# Patient Record
Sex: Male | Born: 1990 | Race: Black or African American | Hispanic: No | Marital: Single | Smoking: Current every day smoker
Health system: Southern US, Community
[De-identification: ages and names within clinical notes are randomized; demographics above are authoritative.]

---
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
## 2015-07-01 ENCOUNTER — Encounter (HOSPITAL_COMMUNITY): Payer: Self-pay | Admitting: *Deleted

## 2015-07-01 ENCOUNTER — Emergency Department (HOSPITAL_COMMUNITY)
Admission: EM | Admit: 2015-07-01 | Discharge: 2015-07-01 | Disposition: A | Discharge status: Home / Self Care | Payer: Self-pay | Attending: Emergency Medicine | Admitting: Emergency Medicine

## 2015-07-01 DIAGNOSIS — Z720 Tobacco use: Secondary | ICD-10-CM | POA: Insufficient documentation

## 2015-07-01 DIAGNOSIS — Z202 Contact with and (suspected) exposure to infections with a predominantly sexual mode of transmission: Secondary | ICD-10-CM | POA: Insufficient documentation

## 2015-07-01 DIAGNOSIS — R011 Cardiac murmur, unspecified: Secondary | ICD-10-CM | POA: Insufficient documentation

## 2015-07-01 MED ORDER — AZITHROMYCIN 250 MG PO TABS
1000.0000 mg | ORAL_TABLET | Freq: Once | ORAL | Status: AC
  Administered 2015-07-01: 1000 mg via ORAL
  Filled 2015-07-01: qty 4

## 2015-07-01 MED ORDER — LIDOCAINE HCL (PF) 1 % IJ SOLN
INTRAMUSCULAR | Status: AC
  Administered 2015-07-01: 2.1 mL
  Filled 2015-07-01: qty 5

## 2015-07-01 MED ORDER — CEFTRIAXONE SODIUM 250 MG IJ SOLR
250.0000 mg | Freq: Once | INTRAMUSCULAR | Status: AC
  Administered 2015-07-01: 250 mg via INTRAMUSCULAR
  Filled 2015-07-01: qty 250

## 2015-07-01 MED ORDER — LIDOCAINE HCL 2 % IJ SOLN
INTRAMUSCULAR | Status: AC
  Filled 2015-07-01: qty 20

## 2015-07-01 NOTE — Discharge Instructions (Signed)
Sexually Transmitted Disease °A sexually transmitted disease (STD) is a disease or infection that may be passed (transmitted) from person to person, usually during sexual activity. This may happen by way of saliva, semen, blood, vaginal mucus, or urine. Common STDs include:  °· Gonorrhea.   °· Chlamydia.   °· Syphilis.   °· HIV and AIDS.   °· Genital herpes.   °· Hepatitis B and C.   °· Trichomonas.   °· Human papillomavirus (HPV).   °· Pubic lice.   °· Scabies. °· Mites. °· Bacterial vaginosis. °WHAT ARE CAUSES OF STDs? °An STD may be caused by bacteria, a virus, or parasites. STDs are often transmitted during sexual activity if one person is infected. However, they may also be transmitted through nonsexual means. STDs may be transmitted after:  °· Sexual intercourse with an infected person.   °· Sharing sex toys with an infected person.   °· Sharing needles with an infected person or using unclean piercing or tattoo needles. °· Having intimate contact with the genitals, mouth, or rectal areas of an infected person.   °· Exposure to infected fluids during birth. °WHAT ARE THE SIGNS AND SYMPTOMS OF STDs? °Different STDs have different symptoms. Some people may not have any symptoms. If symptoms are present, they may include:  °· Painful or bloody urination.   °· Pain in the pelvis, abdomen, vagina, anus, throat, or eyes.   °· A skin rash, itching, or irritation. °· Growths, ulcerations, blisters, or sores in the genital and anal areas. °· Abnormal vaginal discharge with or without bad odor.   °· Penile discharge in men.   °· Fever.   °· Pain or bleeding during sexual intercourse.   °· Swollen glands in the groin area.   °· Yellow skin and eyes (jaundice). This is seen with hepatitis.   °· Swollen testicles. °· Infertility. °· Sores and blisters in the mouth. °HOW ARE STDs DIAGNOSED? °To make a diagnosis, your health care provider may:  °· Take a medical history.   °· Perform a physical exam.   °· Take a sample of  any discharge to examine. °· Swab the throat, cervix, opening to the penis, rectum, or vagina for testing. °· Test a sample of your first morning urine.   °· Perform blood tests.   °· Perform a Pap test, if this applies.   °· Perform a colposcopy.   °· Perform a laparoscopy.   °HOW ARE STDs TREATED? ° Treatment depends on the STD. Some STDs may be treated but not cured.  °· Chlamydia, gonorrhea, trichomonas, and syphilis can be cured with antibiotic medicine.   °· Genital herpes, hepatitis, and HIV can be treated, but not cured, with prescribed medicines. The medicines lessen symptoms.   °· Genital warts from HPV can be treated with medicine or by freezing, burning (electrocautery), or surgery. Warts may come back.   °· HPV cannot be cured with medicine or surgery. However, abnormal areas may be removed from the cervix, vagina, or vulva.   °· If your diagnosis is confirmed, your recent sexual partners need treatment. This is true even if they are symptom-free or have a negative culture or evaluation. They should not have sex until their health care providers say it is okay. °HOW CAN I REDUCE MY RISK OF GETTING AN STD? °Take these steps to reduce your risk of getting an STD: °· Use latex condoms, dental dams, and water-soluble lubricants during sexual activity. Do not use petroleum jelly or oils. °· Avoid having multiple sex partners. °· Do not have sex with someone who has other sex partners. °· Do not have sex with anyone you do not know or who is at   high risk for an STD. °· Avoid risky sex practices that can break your skin. °· Do not have sex if you have open sores on your mouth or skin. °· Avoid drinking too much alcohol or taking illegal drugs. Alcohol and drugs can affect your judgment and put you in a vulnerable position. °· Avoid engaging in oral and anal sex acts. °· Get vaccinated for HPV and hepatitis. If you have not received these vaccines in the past, talk to your health care provider about whether one  or both might be right for you.   °· If you are at risk of being infected with HIV, it is recommended that you take a prescription medicine daily to prevent HIV infection. This is called pre-exposure prophylaxis (PrEP). You are considered at risk if: °¨ You are a man who has sex with other men (MSM). °¨ You are a heterosexual man or woman and are sexually active with more than one partner. °¨ You take drugs by injection. °¨ You are sexually active with a partner who has HIV. °· Talk with your health care provider about whether you are at high risk of being infected with HIV. If you choose to begin PrEP, you should first be tested for HIV. You should then be tested every 3 months for as long as you are taking PrEP.   °WHAT SHOULD I DO IF I THINK I HAVE AN STD? °· See your health care provider.   °· Tell your sexual partner(s). They should be tested and treated for any STDs. °· Do not have sex until your health care provider says it is okay.  °WHEN SHOULD I GET IMMEDIATE MEDICAL CARE? °Contact your health care provider right away if:  °· You have severe abdominal pain. °· You are a man and notice swelling or pain in your testicles. °· You are a woman and notice swelling or pain in your vagina. °Document Released: 12/14/2002 Document Revised: 09/28/2013 Document Reviewed: 04/13/2013 °[employer]® Patient Information ©2015 [employer]. This information is not intended to replace advice given to you by your health care provider. Make sure you discuss any questions you have with your health care provider. ° °Safe Sex °Safe sex is about reducing the risk of giving or getting a sexually transmitted disease (STD). STDs are spread through sexual contact involving the genitals, mouth, or rectum. Some STDs can be cured and others cannot. Safe sex can also prevent unintended pregnancies.  °WHAT ARE SOME SAFE SEX PRACTICES? °· Limit your sexual activity to only one partner who is having sex with only you. °· Talk to your partner  about his or her past partners, past STDs, and drug use. °· Use a condom every time you have sexual intercourse. This includes vaginal, oral, and anal sexual activity. Both females and males should wear condoms during oral sex. Only use latex or polyurethane condoms and water-based lubricants. Using petroleum-based lubricants or oils to lubricate a condom will weaken the condom and increase the chance that it will break. The condom should be in place from the beginning to the end of sexual activity. Wearing a condom reduces, but does not completely eliminate, your risk of getting or giving an STD. STDs can be spread by contact with infected body fluids and skin. °· Get vaccinated for hepatitis B and HPV. °· Avoid alcohol and recreational drugs, which can affect your judgment. You may forget to use a condom or participate in high-risk sex. °· For females, avoid douching after sexual intercourse. Douching can spread an infection   farther into the reproductive tract. °· Check your body for signs of sores, blisters, rashes, or unusual discharge. See your health care provider if you notice any of these signs. °· Avoid sexual contact if you have symptoms of an infection or are being treated for an STD. If you or your partner has herpes, avoid sexual contact when blisters are present. Use condoms at all other times. °· If you are at risk of being infected with HIV, it is recommended that you take a prescription medicine daily to prevent HIV infection. This is called pre-exposure prophylaxis (PrEP). You are considered at risk if: °¨ You are a man who has sex with other men (MSM). °¨ You are a heterosexual man or woman who is sexually active with more than one partner. °¨ You take drugs by injection. °¨ You are sexually active with a partner who has HIV. °· Talk with your health care provider about whether you are at high risk of being infected with HIV. If you choose to begin PrEP, you should first be tested for HIV. You  should then be tested every 3 months for as long as you are taking PrEP. °· See your health care provider for regular screenings, exams, and tests for other STDs. Before having sex with a new partner, each of you should be screened for STDs and should talk about the results with each other. °WHAT ARE THE BENEFITS OF SAFE SEX?  °· There is less chance of getting or giving an STD. °· You can prevent unwanted or unintended pregnancies. °· By discussing safe sex concerns with your partner, you may increase feelings of intimacy, comfort, trust, and honesty between the two of you. °Document Released: 10/31/2004 Document Revised: 02/07/2014 Document Reviewed: 03/16/2012 °[employer]® Patient Information ©2015 [employer]. This information is not intended to replace advice given to you by your health care provider. Make sure you discuss any questions you have with your health care provider. ° °

## 2015-07-01 NOTE — ED Provider Notes (Signed)
CSN: [id number]     Arrival date & time 07/01/15  1452 History  This chart was scribed for Joycie Peek, PA-C, working with Derwood Kaplan, MD by Chestine Spore, ED Scribe. The patient was seen in room WTR6/WTR6 at 3:17 PM.    Chief Complaint  Patient presents with  . Exposure to STD     The history is provided by the patient. No language interpreter was used.    Steven [name] is a 24 y.o. male who presents to the Emergency Department complaining of exposure to STD onset 3 weeks. He notes that he was informed by his girlfriend 3 weeks ago that she had an STD and he thinks she had chlamydia. He states that she is having associated symptoms of intermittent dysuria. He denies penile discharge, penile pain/swelling, testicular pain/swelling, abdominal pain, n/v, fever, chills, back pain, rectal pain, hematuria, and any other symptoms. Denies allergies to any medications.    History reviewed. No pertinent past medical history. History reviewed. No pertinent past surgical history. History reviewed. No pertinent family history. Social History  Substance Use Topics  . Smoking status: Current Every Day Smoker    Types: Cigars  . Smokeless tobacco: None  . Alcohol Use: No    Review of Systems  Gastrointestinal: Negative for abdominal pain and rectal pain.  Genitourinary: Positive for dysuria. Negative for hematuria, discharge, penile swelling, scrotal swelling, difficulty urinating, genital sores, penile pain and testicular pain.  Musculoskeletal: Negative for back pain.  Skin: Negative for color change and rash.      Allergies  Review of patient's allergies indicates not on file.  Home Medications   Prior to Admission medications   Not on File   BP 120/60 mmHg  Pulse 62  Temp(Src) 98.3 F (36.8 C) (Oral)  Ht  (1.753 m)  Wt 140 lb (63.504 kg)  BMI 20.67 kg/m2  SpO2 97% Physical Exam  Constitutional: He is oriented to person, place, and time. He appears well-developed  and well-nourished. No distress.  HENT:  Head: Normocephalic and atraumatic.  Eyes: EOM are normal.  Neck: Neck supple.  Cardiovascular: Normal rate and regular rhythm.  Exam reveals no gallop and no friction rub.   Murmur heard.  Systolic murmur is present with a grade of 1/6  Pulmonary/Chest: Effort normal and breath sounds normal. No respiratory distress. He has no wheezes. He has no rales.  Abdominal: Soft. There is no tenderness. Hernia confirmed negative in the right inguinal area and confirmed negative in the left inguinal area.  Genitourinary: Testes normal and penis normal. Right testis shows no swelling and no tenderness. Left testis shows no swelling and no tenderness. Circumcised. No penile tenderness. No discharge found.  Chaperone present for GU exam. No other lesions or deformities. Testicles nl. No orchitis. No epididymitis.   Musculoskeletal: Normal range of motion.  Lymphadenopathy:       Right: No inguinal adenopathy present.       Left: No inguinal adenopathy present.  Neurological: He is alert and oriented to person, place, and time.  Skin: Skin is warm and dry.  Psychiatric: He has a normal mood and affect. His behavior is normal.  Nursing note and vitals reviewed.   ED Course  Procedures (including critical care time) DIAGNOSTIC STUDIES: Oxygen Saturation is 97% on RA, nl by my interpretation.    COORDINATION OF CARE: 3:22 PM Discussed treatment plan with pt at bedside which includes UA GC/Chlamydia, rocephin injection and Zithromax and pt agreed to plan.  Labs Review Labs Reviewed - No data to display  Imaging Review No results found. I have personally reviewed and evaluated these images and lab results as part of my medical decision-making.   EKG Interpretation None     Meds given in ED:  Medications  cefTRIAXone (ROCEPHIN) injection 250 mg (250 mg Intramuscular Given 07/01/15 1554)  azithromycin (ZITHROMAX) tablet 1,000 mg (1,000 mg Oral Given  07/01/15 1552)  lidocaine (PF) (XYLOCAINE) 1 % injection (2.1 mLs  Given 07/01/15 1554)    There are no discharge medications for this patient.  Filed Vitals:   07/01/15 1504  BP: 120/60  Pulse: 62  Temp: 98.3 F (36.8 C)  TempSrc: Oral  Height:  (1.753 m)  Weight: 140 lb (63.504 kg)  SpO2: 97%    MDM  Vitals stable - WNL -afebrile Pt resting comfortably in ED. PE--normal GU exam. Labwork--pending GC chlamydia urine. Treated empirically for GC chlamydia with ceftriaxone and azithromycin in the ED. Discussed follow-up with PCP/health department for further evaluation of GU symptoms. Also discussed safe sex practices. I discussed all relevant lab findings and imaging results with pt and they verbalized understanding. Discussed f/u with PCP within 48 hrs and return precautions, pt very amenable to plan.  Final diagnoses:  Exposure to STD    I personally performed the services described in this documentation, which was scribed in my presence. The recorded information has been reviewed and is accurate.    Joycie Peek, PA-C 07/01/15 1732  Derwood Kaplan, MD 07/02/15 [phone]

## 2015-07-01 NOTE — ED Notes (Signed)
Pt states "she was told she had an STD.  I have had some burning."  Pt denies penile d/c.

## 2015-07-02 LAB — HIV ANTIBODY (ROUTINE TESTING W REFLEX): HIV Screen 4th Generation wRfx: NONREACTIVE

## 2015-07-03 LAB — GC/CHLAMYDIA PROBE AMP (~~LOC~~) NOT AT ARMC
Chlamydia: POSITIVE — AB
Neisseria Gonorrhea: NEGATIVE

## 2015-07-05 ENCOUNTER — Telehealth (HOSPITAL_BASED_OUTPATIENT_CLINIC_OR_DEPARTMENT_OTHER): Payer: Self-pay | Admitting: Emergency Medicine

## 2015-08-12 ENCOUNTER — Telehealth (HOSPITAL_COMMUNITY): Payer: Self-pay

## 2015-08-12 NOTE — Telephone Encounter (Signed)
Unable to contact pt by mail or telephone. Unable to communicate lab results or treatment changes. 

## 2016-08-06 ENCOUNTER — Inpatient Hospital Stay (HOSPITAL_COMMUNITY)
Admission: EM | Admit: 2016-08-06 | Discharge: 2016-08-14 | DRG: 958 | Disposition: A | Discharge status: Rehabilitation Facility | Payer: No Typology Code available for payment source | Attending: Surgery | Admitting: Surgery

## 2016-08-06 ENCOUNTER — Other Ambulatory Visit (HOSPITAL_COMMUNITY): Payer: Self-pay | Admitting: Radiology

## 2016-08-06 ENCOUNTER — Emergency Department (HOSPITAL_COMMUNITY): Payer: No Typology Code available for payment source

## 2016-08-06 ENCOUNTER — Encounter (HOSPITAL_COMMUNITY): Payer: Self-pay | Admitting: Emergency Medicine

## 2016-08-06 DIAGNOSIS — T07XXXA Unspecified multiple injuries, initial encounter: Secondary | ICD-10-CM | POA: Diagnosis present

## 2016-08-06 DIAGNOSIS — S22009A Unspecified fracture of unspecified thoracic vertebra, initial encounter for closed fracture: Secondary | ICD-10-CM | POA: Diagnosis present

## 2016-08-06 DIAGNOSIS — S060XAA Concussion with loss of consciousness status unknown, initial encounter: Secondary | ICD-10-CM | POA: Diagnosis present

## 2016-08-06 DIAGNOSIS — Z720 Tobacco use: Secondary | ICD-10-CM

## 2016-08-06 DIAGNOSIS — E8809 Other disorders of plasma-protein metabolism, not elsewhere classified: Secondary | ICD-10-CM

## 2016-08-06 DIAGNOSIS — F17290 Nicotine dependence, other tobacco product, uncomplicated: Secondary | ICD-10-CM | POA: Diagnosis present

## 2016-08-06 DIAGNOSIS — R079 Chest pain, unspecified: Secondary | ICD-10-CM | POA: Diagnosis not present

## 2016-08-06 DIAGNOSIS — S42309A Unspecified fracture of shaft of humerus, unspecified arm, initial encounter for closed fracture: Secondary | ICD-10-CM | POA: Diagnosis present

## 2016-08-06 DIAGNOSIS — S60511A Abrasion of right hand, initial encounter: Secondary | ICD-10-CM | POA: Diagnosis present

## 2016-08-06 DIAGNOSIS — D72820 Lymphocytosis (symptomatic): Secondary | ICD-10-CM

## 2016-08-06 DIAGNOSIS — S82401A Unspecified fracture of shaft of right fibula, initial encounter for closed fracture: Secondary | ICD-10-CM | POA: Diagnosis present

## 2016-08-06 DIAGNOSIS — E46 Unspecified protein-calorie malnutrition: Secondary | ICD-10-CM

## 2016-08-06 DIAGNOSIS — S42351A Displaced comminuted fracture of shaft of humerus, right arm, initial encounter for closed fracture: Secondary | ICD-10-CM | POA: Diagnosis present

## 2016-08-06 DIAGNOSIS — S32810A Multiple fractures of pelvis with stable disruption of pelvic ring, initial encounter for closed fracture: Principal | ICD-10-CM

## 2016-08-06 DIAGNOSIS — S02113A Unspecified occipital condyle fracture, initial encounter for closed fracture: Secondary | ICD-10-CM | POA: Diagnosis present

## 2016-08-06 DIAGNOSIS — M792 Neuralgia and neuritis, unspecified: Secondary | ICD-10-CM

## 2016-08-06 DIAGNOSIS — S27321A Contusion of lung, unilateral, initial encounter: Secondary | ICD-10-CM | POA: Diagnosis present

## 2016-08-06 DIAGNOSIS — S42301A Unspecified fracture of shaft of humerus, right arm, initial encounter for closed fracture: Secondary | ICD-10-CM | POA: Diagnosis present

## 2016-08-06 DIAGNOSIS — D62 Acute posthemorrhagic anemia: Secondary | ICD-10-CM | POA: Diagnosis not present

## 2016-08-06 DIAGNOSIS — S060X9A Concussion with loss of consciousness of unspecified duration, initial encounter: Secondary | ICD-10-CM | POA: Diagnosis present

## 2016-08-06 DIAGNOSIS — Y92410 Unspecified street and highway as the place of occurrence of the external cause: Secondary | ICD-10-CM

## 2016-08-06 DIAGNOSIS — G8918 Other acute postprocedural pain: Secondary | ICD-10-CM

## 2016-08-06 DIAGNOSIS — S270XXA Traumatic pneumothorax, initial encounter: Secondary | ICD-10-CM | POA: Diagnosis present

## 2016-08-06 DIAGNOSIS — K5903 Drug induced constipation: Secondary | ICD-10-CM

## 2016-08-06 DIAGNOSIS — Z419 Encounter for procedure for purposes other than remedying health state, unspecified: Secondary | ICD-10-CM

## 2016-08-06 DIAGNOSIS — S129XXA Fracture of neck, unspecified, initial encounter: Secondary | ICD-10-CM | POA: Diagnosis present

## 2016-08-06 DIAGNOSIS — J939 Pneumothorax, unspecified: Secondary | ICD-10-CM

## 2016-08-06 DIAGNOSIS — R339 Retention of urine, unspecified: Secondary | ICD-10-CM

## 2016-08-06 DIAGNOSIS — S32599A Other specified fracture of unspecified pubis, initial encounter for closed fracture: Secondary | ICD-10-CM | POA: Diagnosis present

## 2016-08-06 DIAGNOSIS — S82141A Displaced bicondylar fracture of right tibia, initial encounter for closed fracture: Secondary | ICD-10-CM | POA: Diagnosis present

## 2016-08-06 LAB — I-STAT CG4 LACTIC ACID, ED: LACTIC ACID, VENOUS: 2.82 mmol/L — AB (ref 0.5–1.9)

## 2016-08-06 MED ORDER — FENTANYL CITRATE (PF) 100 MCG/2ML IJ SOLN
100.0000 ug | Freq: Once | INTRAMUSCULAR | Status: AC
  Administered 2016-08-07: 100 ug via INTRAVENOUS

## 2016-08-06 MED ORDER — FENTANYL CITRATE (PF) 100 MCG/2ML IJ SOLN
INTRAMUSCULAR | Status: AC
  Administered 2016-08-07: 100 ug via INTRAVENOUS
  Filled 2016-08-06: qty 2

## 2016-08-06 MED ORDER — IOPAMIDOL (ISOVUE-300) INJECTION 61%
INTRAVENOUS | Status: AC
  Filled 2016-08-06: qty 100

## 2016-08-06 MED ORDER — LIDOCAINE-EPINEPHRINE (PF) 2 %-1:200000 IJ SOLN
10.0000 mL | Freq: Once | INTRAMUSCULAR | Status: AC
  Administered 2016-08-06: 10 mL
  Filled 2016-08-06: qty 20

## 2016-08-06 NOTE — ED Notes (Signed)
Family at bedside. 
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
## 2016-08-06 NOTE — ED Notes (Signed)
Patient's sister at bedside.
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
## 2016-08-07 ENCOUNTER — Encounter (HOSPITAL_COMMUNITY): Payer: Self-pay | Admitting: Radiology

## 2016-08-07 ENCOUNTER — Emergency Department (HOSPITAL_COMMUNITY): Payer: No Typology Code available for payment source

## 2016-08-07 DIAGNOSIS — S27321A Contusion of lung, unilateral, initial encounter: Secondary | ICD-10-CM | POA: Diagnosis present

## 2016-08-07 DIAGNOSIS — T07XXXA Unspecified multiple injuries, initial encounter: Secondary | ICD-10-CM | POA: Diagnosis present

## 2016-08-07 DIAGNOSIS — S42309A Unspecified fracture of shaft of humerus, unspecified arm, initial encounter for closed fracture: Secondary | ICD-10-CM | POA: Diagnosis present

## 2016-08-07 DIAGNOSIS — S42301A Unspecified fracture of shaft of humerus, right arm, initial encounter for closed fracture: Secondary | ICD-10-CM | POA: Diagnosis present

## 2016-08-07 DIAGNOSIS — S060X9A Concussion with loss of consciousness of unspecified duration, initial encounter: Secondary | ICD-10-CM | POA: Diagnosis present

## 2016-08-07 DIAGNOSIS — S129XXA Fracture of neck, unspecified, initial encounter: Secondary | ICD-10-CM | POA: Diagnosis present

## 2016-08-07 DIAGNOSIS — D62 Acute posthemorrhagic anemia: Secondary | ICD-10-CM | POA: Diagnosis not present

## 2016-08-07 DIAGNOSIS — S82401A Unspecified fracture of shaft of right fibula, initial encounter for closed fracture: Secondary | ICD-10-CM | POA: Diagnosis present

## 2016-08-07 DIAGNOSIS — S22009A Unspecified fracture of unspecified thoracic vertebra, initial encounter for closed fracture: Secondary | ICD-10-CM | POA: Diagnosis present

## 2016-08-07 DIAGNOSIS — S32599A Other specified fracture of unspecified pubis, initial encounter for closed fracture: Secondary | ICD-10-CM | POA: Diagnosis present

## 2016-08-07 DIAGNOSIS — S060XAA Concussion with loss of consciousness status unknown, initial encounter: Secondary | ICD-10-CM | POA: Diagnosis present

## 2016-08-07 DIAGNOSIS — S82141A Displaced bicondylar fracture of right tibia, initial encounter for closed fracture: Secondary | ICD-10-CM | POA: Diagnosis present

## 2016-08-07 DIAGNOSIS — S02113A Unspecified occipital condyle fracture, initial encounter for closed fracture: Secondary | ICD-10-CM | POA: Diagnosis present

## 2016-08-07 LAB — COMPREHENSIVE METABOLIC PANEL
ALBUMIN: 3.4 g/dL — AB (ref 3.5–5.0)
ALK PHOS: 31 U/L — AB (ref 38–126)
ALK PHOS: 36 U/L — AB (ref 38–126)
ALT: 140 U/L — AB (ref 17–63)
ALT: 165 U/L — AB (ref 17–63)
ANION GAP: 8 (ref 5–15)
ANION GAP: 9 (ref 5–15)
AST: 202 U/L — AB (ref 15–41)
AST: 301 U/L — ABNORMAL HIGH (ref 15–41)
Albumin: 3.8 g/dL (ref 3.5–5.0)
BILIRUBIN TOTAL: 0.9 mg/dL (ref 0.3–1.2)
BUN: 10 mg/dL (ref 6–20)
BUN: 8 mg/dL (ref 6–20)
CALCIUM: 8.2 mg/dL — AB (ref 8.9–10.3)
CALCIUM: 8.8 mg/dL — AB (ref 8.9–10.3)
CO2: 25 mmol/L (ref 22–32)
CO2: 26 mmol/L (ref 22–32)
CREATININE: 1.02 mg/dL (ref 0.61–1.24)
Chloride: 106 mmol/L (ref 101–111)
Chloride: 107 mmol/L (ref 101–111)
Creatinine, Ser: 0.89 mg/dL (ref 0.61–1.24)
GFR calc Af Amer: >60 mL/min (ref >60)
GFR calc non Af Amer: >60 mL/min (ref >60)
GFR calc non Af Amer: >60 mL/min (ref >60)
GLUCOSE: 144 mg/dL — AB (ref 65–99)
GLUCOSE: 164 mg/dL — AB (ref 65–99)
Potassium: 3.4 mmol/L — ABNORMAL LOW (ref 3.5–5.1)
Potassium: 3.5 mmol/L (ref 3.5–5.1)
SODIUM: 141 mmol/L (ref 135–145)
Sodium: 140 mmol/L (ref 135–145)
TOTAL PROTEIN: 5.8 g/dL — AB (ref 6.5–8.1)
Total Bilirubin: 1 mg/dL (ref 0.3–1.2)
Total Protein: 5.3 g/dL — ABNORMAL LOW (ref 6.5–8.1)

## 2016-08-07 LAB — CBC WITH DIFFERENTIAL/PLATELET
Basophils Absolute: 0 10*3/uL (ref 0.0–0.1)
Basophils Relative: 0 %
Eosinophils Absolute: 0.1 10*3/uL (ref 0.0–0.7)
Eosinophils Relative: 0 %
HEMATOCRIT: 39.3 % (ref 39.0–52.0)
HEMOGLOBIN: 13.3 g/dL (ref 13.0–17.0)
LYMPHS ABS: 1.5 10*3/uL (ref 0.7–4.0)
LYMPHS PCT: 7 %
MCH: 30 pg (ref 26.0–34.0)
MCHC: 33.8 g/dL (ref 30.0–36.0)
MCV: 88.7 fL (ref 78.0–100.0)
MONOS PCT: 4 %
Monocytes Absolute: 0.9 10*3/uL (ref 0.1–1.0)
NEUTROS ABS: 19.4 10*3/uL — AB (ref 1.7–7.7)
NEUTROS PCT: 89 %
Platelets: 161 10*3/uL (ref 150–400)
RBC: 4.43 MIL/uL (ref 4.22–5.81)
RDW: 13.1 % (ref 11.5–15.5)
WBC: 21.9 10*3/uL — AB (ref 4.0–10.5)

## 2016-08-07 LAB — LIPASE, BLOOD
LIPASE: 23 U/L (ref 11–51)
Lipase: 27 U/L (ref 11–51)

## 2016-08-07 LAB — CBC
HCT: 34.7 % — ABNORMAL LOW (ref 39.0–52.0)
HEMOGLOBIN: 11.7 g/dL — AB (ref 13.0–17.0)
MCH: 29.3 pg (ref 26.0–34.0)
MCHC: 33.7 g/dL (ref 30.0–36.0)
MCV: 86.8 fL (ref 78.0–100.0)
Platelets: 164 10*3/uL (ref 150–400)
RBC: 4 MIL/uL — AB (ref 4.22–5.81)
RDW: 12.8 % (ref 11.5–15.5)
WBC: 26.6 10*3/uL — AB (ref 4.0–10.5)

## 2016-08-07 LAB — PROTIME-INR
INR: 1.36
Prothrombin Time: 16.9 seconds — ABNORMAL HIGH (ref 11.4–15.2)

## 2016-08-07 MED ORDER — ENOXAPARIN SODIUM 40 MG/0.4ML ~~LOC~~ SOLN
40.0000 mg | SUBCUTANEOUS | Status: DC
  Administered 2016-08-07 – 2016-08-08 (×2): 40 mg via SUBCUTANEOUS
  Filled 2016-08-07 (×2): qty 0.4

## 2016-08-07 MED ORDER — ACETAMINOPHEN 325 MG PO TABS: 650.0000 mg | ORAL_TABLET | ORAL | Status: DC | PRN

## 2016-08-07 MED ORDER — BISACODYL 10 MG RE SUPP: 10.0000 mg | Freq: Every day | RECTAL | Status: DC | PRN

## 2016-08-07 MED ORDER — SODIUM CHLORIDE 0.9 % IV SOLN
INTRAVENOUS | Status: DC
  Administered 2016-08-07 (×2): via INTRAVENOUS

## 2016-08-07 MED ORDER — MORPHINE SULFATE (PF) 4 MG/ML IV SOLN
4.0000 mg | Freq: Once | INTRAVENOUS | Status: AC
  Administered 2016-08-07: 4 mg via INTRAVENOUS
  Filled 2016-08-07: qty 1

## 2016-08-07 MED ORDER — TETANUS-DIPHTH-ACELL PERTUSSIS 5-2.5-18.5 LF-MCG/0.5 IM SUSP
0.5000 mL | Freq: Once | INTRAMUSCULAR | Status: AC
  Administered 2016-08-07: 0.5 mL via INTRAMUSCULAR
  Filled 2016-08-07: qty 0.5

## 2016-08-07 MED ORDER — ONDANSETRON HCL 4 MG PO TABS: 4.0000 mg | ORAL_TABLET | Freq: Four times a day (QID) | ORAL | Status: DC | PRN

## 2016-08-07 MED ORDER — ONDANSETRON HCL 4 MG/2ML IJ SOLN: 4.0000 mg | Freq: Four times a day (QID) | INTRAMUSCULAR | Status: DC | PRN

## 2016-08-07 MED ORDER — SODIUM CHLORIDE 0.9 % IV BOLUS (SEPSIS)
1000.0000 mL | Freq: Once | INTRAVENOUS | Status: AC
  Administered 2016-08-07: 1000 mL via INTRAVENOUS

## 2016-08-07 MED ORDER — DOCUSATE SODIUM 100 MG PO CAPS
100.0000 mg | ORAL_CAPSULE | Freq: Two times a day (BID) | ORAL | Status: DC
  Administered 2016-08-07 – 2016-08-13 (×12): 100 mg via ORAL
  Filled 2016-08-07 (×14): qty 1

## 2016-08-07 MED ORDER — HYDROMORPHONE HCL 2 MG/ML IJ SOLN
0.5000 mg | INTRAMUSCULAR | Status: DC | PRN
  Administered 2016-08-07 (×4): 0.5 mg via INTRAVENOUS
  Filled 2016-08-07 (×4): qty 1

## 2016-08-07 MED ORDER — IOPAMIDOL (ISOVUE-300) INJECTION 61%
100.0000 mL | Freq: Once | INTRAVENOUS | Status: AC | PRN
  Administered 2016-08-07: 100 mL via INTRAVENOUS

## 2016-08-07 MED ORDER — OXYCODONE HCL 5 MG PO TABS
5.0000 mg | ORAL_TABLET | ORAL | Status: DC | PRN
  Administered 2016-08-07: 5 mg via ORAL
  Filled 2016-08-07: qty 1

## 2016-08-07 MED ORDER — BACITRACIN ZINC 500 UNIT/GM EX OINT
TOPICAL_OINTMENT | Freq: Two times a day (BID) | CUTANEOUS | Status: DC
  Administered 2016-08-07 – 2016-08-14 (×14): via TOPICAL
  Filled 2016-08-07 (×3): qty 28.35
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
## 2016-08-07 NOTE — ED Notes (Signed)
Patient transported to CT 

## 2016-08-07 NOTE — ED Notes (Signed)
PA-C suturing patient's hand lacs at this time.

## 2016-08-07 NOTE — Progress Notes (Addendum)
Orthopedic Tech Progress Note Patient Details:  [name] 08/24/1991 [id number]  Ortho Devices Type of Ortho Device: Coapt, Knee Immobilizer Ortho Device/Splint Location: rue coapt splint, rle knee imm Ortho Device/Splint Interventions: Ordered, Application Dr applied traction while I applied coapt splint.  [name], Starkeisha Vanwinkle J 08/07/2016, 12:34 AM
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
## 2016-08-07 NOTE — Progress Notes (Signed)
Notified MD pt has not voided since I/O at 0615 this morning.  MD says to use foley to collect urine and if greater than 300 to leave foley in.  Orders to be carried out.  Will continue to monitor.
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
## 2016-08-08 ENCOUNTER — Observation Stay (HOSPITAL_COMMUNITY): Payer: No Typology Code available for payment source

## 2016-08-08 ENCOUNTER — Inpatient Hospital Stay (HOSPITAL_COMMUNITY): Payer: No Typology Code available for payment source

## 2016-08-08 DIAGNOSIS — F17290 Nicotine dependence, other tobacco product, uncomplicated: Secondary | ICD-10-CM | POA: Diagnosis present

## 2016-08-08 DIAGNOSIS — S02113A Unspecified occipital condyle fracture, initial encounter for closed fracture: Secondary | ICD-10-CM | POA: Diagnosis present

## 2016-08-08 DIAGNOSIS — R339 Retention of urine, unspecified: Secondary | ICD-10-CM | POA: Diagnosis present

## 2016-08-08 DIAGNOSIS — S42351A Displaced comminuted fracture of shaft of humerus, right arm, initial encounter for closed fracture: Secondary | ICD-10-CM | POA: Diagnosis present

## 2016-08-08 DIAGNOSIS — R079 Chest pain, unspecified: Secondary | ICD-10-CM | POA: Diagnosis present

## 2016-08-08 DIAGNOSIS — S27321A Contusion of lung, unilateral, initial encounter: Secondary | ICD-10-CM | POA: Diagnosis present

## 2016-08-08 DIAGNOSIS — S32810A Multiple fractures of pelvis with stable disruption of pelvic ring, initial encounter for closed fracture: Secondary | ICD-10-CM | POA: Diagnosis present

## 2016-08-08 DIAGNOSIS — S270XXA Traumatic pneumothorax, initial encounter: Secondary | ICD-10-CM | POA: Diagnosis present

## 2016-08-08 DIAGNOSIS — S060X9A Concussion with loss of consciousness of unspecified duration, initial encounter: Secondary | ICD-10-CM | POA: Diagnosis present

## 2016-08-08 DIAGNOSIS — S60511A Abrasion of right hand, initial encounter: Secondary | ICD-10-CM | POA: Diagnosis present

## 2016-08-08 DIAGNOSIS — S82401A Unspecified fracture of shaft of right fibula, initial encounter for closed fracture: Secondary | ICD-10-CM | POA: Diagnosis present

## 2016-08-08 DIAGNOSIS — Y92410 Unspecified street and highway as the place of occurrence of the external cause: Secondary | ICD-10-CM | POA: Diagnosis not present

## 2016-08-08 DIAGNOSIS — D62 Acute posthemorrhagic anemia: Secondary | ICD-10-CM | POA: Diagnosis present

## 2016-08-08 DIAGNOSIS — S82141A Displaced bicondylar fracture of right tibia, initial encounter for closed fracture: Secondary | ICD-10-CM | POA: Diagnosis present

## 2016-08-08 LAB — CBC
HEMATOCRIT: 29.4 % — AB (ref 39.0–52.0)
HEMOGLOBIN: 9.9 g/dL — AB (ref 13.0–17.0)
MCH: 29 pg (ref 26.0–34.0)
MCHC: 33.7 g/dL (ref 30.0–36.0)
MCV: 86.2 fL (ref 78.0–100.0)
Platelets: 107 10*3/uL — ABNORMAL LOW (ref 150–400)
RBC: 3.41 MIL/uL — AB (ref 4.22–5.81)
RDW: 12.7 % (ref 11.5–15.5)
WBC: 12.7 10*3/uL — ABNORMAL HIGH (ref 4.0–10.5)

## 2016-08-08 LAB — PROTIME-INR
INR: 1.19
PROTHROMBIN TIME: 15.2 s (ref 11.4–15.2)

## 2016-08-08 LAB — APTT: APTT: 37 s — AB (ref 24–36)

## 2016-08-08 MED ORDER — HYDROMORPHONE HCL 1 MG/ML IJ SOLN
0.5000 mg | INTRAMUSCULAR | Status: DC | PRN
  Administered 2016-08-09: 0.5 mg via INTRAVENOUS
  Filled 2016-08-08: qty 0.5

## 2016-08-08 MED ORDER — OXYCODONE HCL 5 MG PO TABS
5.0000 mg | ORAL_TABLET | ORAL | Status: DC | PRN
  Administered 2016-08-08 – 2016-08-12 (×13): 15 mg via ORAL
  Filled 2016-08-08 (×13): qty 3

## 2016-08-08 MED ORDER — CEFAZOLIN SODIUM-DEXTROSE 2-4 GM/100ML-% IV SOLN
2.0000 g | Freq: Once | INTRAVENOUS | Status: AC
  Administered 2016-08-09: 2 g via INTRAVENOUS
  Filled 2016-08-08: qty 100

## 2016-08-08 MED ORDER — POLYETHYLENE GLYCOL 3350 17 G PO PACK
17.0000 g | PACK | Freq: Every day | ORAL | Status: DC
  Administered 2016-08-08 – 2016-08-13 (×5): 17 g via ORAL
  Filled 2016-08-08 (×6): qty 1
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
## 2016-08-08 NOTE — Progress Notes (Signed)
Right proximal forearm is tight, but has good neurological function and good sensation.  Orthopedic surgeon has not seen this patient in > 24 hours.  I loosened the dressing and left the splint in place.    Marta LamasJames O. Gae BonWyatt, III, MD, FACS [phone] Trauma Surgeon

## 2016-08-08 NOTE — Progress Notes (Signed)
Noted that pt's right arm with splint and compression wrap, swelling has increase and tight, pt verbalized increase discomfort as well. Dr. Lindie SpruceWyatt paged to inform.

## 2016-08-08 NOTE — Progress Notes (Signed)
Dr. Lindie SpruceWyatt at bedside to assess pt.
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
## 2016-08-09 ENCOUNTER — Inpatient Hospital Stay (HOSPITAL_COMMUNITY): Payer: No Typology Code available for payment source | Admitting: Certified Registered Nurse Anesthetist

## 2016-08-09 ENCOUNTER — Inpatient Hospital Stay (HOSPITAL_COMMUNITY): Payer: No Typology Code available for payment source

## 2016-08-09 ENCOUNTER — Encounter (HOSPITAL_COMMUNITY): Payer: Self-pay | Admitting: Certified Registered Nurse Anesthetist

## 2016-08-09 ENCOUNTER — Encounter (HOSPITAL_COMMUNITY): Admission: EM | Disposition: A | Discharge status: Rehabilitation Facility | Payer: Self-pay | Source: Home / Self Care

## 2016-08-09 HISTORY — PX: ORIF HUMERUS FRACTURE: SHX2126

## 2016-08-09 HISTORY — PX: ORIF TIBIA FRACTURE: SHX5416

## 2016-08-09 LAB — CBC
HCT: 27 % — ABNORMAL LOW (ref 39.0–52.0)
Hemoglobin: 9.2 g/dL — ABNORMAL LOW (ref 13.0–17.0)
MCH: 29.3 pg (ref 26.0–34.0)
MCHC: 34.1 g/dL (ref 30.0–36.0)
MCV: 86 fL (ref 78.0–100.0)
PLATELETS: 113 10*3/uL — AB (ref 150–400)
RBC: 3.14 MIL/uL — AB (ref 4.22–5.81)
RDW: 12.6 % (ref 11.5–15.5)
WBC: 10.7 10*3/uL — AB (ref 4.0–10.5)

## 2016-08-09 LAB — COMPREHENSIVE METABOLIC PANEL
ALT: 81 U/L — AB (ref 17–63)
ANION GAP: 5 (ref 5–15)
AST: 79 U/L — ABNORMAL HIGH (ref 15–41)
Albumin: 3 g/dL — ABNORMAL LOW (ref 3.5–5.0)
Alkaline Phosphatase: 33 U/L — ABNORMAL LOW (ref 38–126)
BUN: 7 mg/dL (ref 6–20)
CHLORIDE: 101 mmol/L (ref 101–111)
CO2: 29 mmol/L (ref 22–32)
Calcium: 8.6 mg/dL — ABNORMAL LOW (ref 8.9–10.3)
Creatinine, Ser: 0.69 mg/dL (ref 0.61–1.24)
Glucose, Bld: 115 mg/dL — ABNORMAL HIGH (ref 65–99)
POTASSIUM: 3.9 mmol/L (ref 3.5–5.1)
SODIUM: 135 mmol/L (ref 135–145)
Total Bilirubin: 1.1 mg/dL (ref 0.3–1.2)
Total Protein: 5.6 g/dL — ABNORMAL LOW (ref 6.5–8.1)

## 2016-08-09 LAB — SURGICAL PCR SCREEN
MRSA, PCR: NEGATIVE
STAPHYLOCOCCUS AUREUS: NEGATIVE

## 2016-08-09 LAB — TYPE AND SCREEN
ABO/RH(D): O POS
ANTIBODY SCREEN: NEGATIVE

## 2016-08-09 LAB — ABO/RH: ABO/RH(D): O POS

## 2016-08-09 SURGERY — OPEN REDUCTION INTERNAL FIXATION (ORIF) DISTAL HUMERUS FRACTURE
Anesthesia: General | Laterality: Right

## 2016-08-09 MED ORDER — ACETAMINOPHEN 650 MG RE SUPP: 650.0000 mg | Freq: Four times a day (QID) | RECTAL | Status: DC | PRN

## 2016-08-09 MED ORDER — PROMETHAZINE HCL 25 MG/ML IJ SOLN
6.2500 mg | INTRAMUSCULAR | Status: DC | PRN
Start: 2016-08-09 — End: 2016-08-09

## 2016-08-09 MED ORDER — FENTANYL CITRATE (PF) 100 MCG/2ML IJ SOLN
INTRAMUSCULAR | Status: DC | PRN
  Administered 2016-08-09 (×7): 50 ug via INTRAVENOUS
  Administered 2016-08-09: 100 ug via INTRAVENOUS
  Administered 2016-08-09: 50 ug via INTRAVENOUS

## 2016-08-09 MED ORDER — ONDANSETRON HCL 4 MG PO TABS: 4.0000 mg | ORAL_TABLET | Freq: Four times a day (QID) | ORAL | Status: DC | PRN

## 2016-08-09 MED ORDER — MEPERIDINE HCL 25 MG/ML IJ SOLN: 6.2500 mg | INTRAMUSCULAR | Status: DC | PRN

## 2016-08-09 MED ORDER — METHOCARBAMOL 500 MG PO TABS
500.0000 mg | ORAL_TABLET | Freq: Four times a day (QID) | ORAL | Status: DC | PRN
  Administered 2016-08-10: 1000 mg via ORAL
  Administered 2016-08-10: 500 mg via ORAL
  Administered 2016-08-10 – 2016-08-14 (×11): 1000 mg via ORAL
  Filled 2016-08-09 (×4): qty 2
  Filled 2016-08-09: qty 1
  Filled 2016-08-09 (×9): qty 2

## 2016-08-09 MED ORDER — HYDROMORPHONE HCL 1 MG/ML IJ SOLN: 0.2500 mg | INTRAMUSCULAR | Status: DC | PRN

## 2016-08-09 MED ORDER — ACETAMINOPHEN 325 MG PO TABS: 650.0000 mg | ORAL_TABLET | Freq: Four times a day (QID) | ORAL | Status: DC | PRN

## 2016-08-09 MED ORDER — PROPOFOL 10 MG/ML IV BOLUS
INTRAVENOUS | Status: DC | PRN
  Administered 2016-08-09: 180 mg via INTRAVENOUS

## 2016-08-09 MED ORDER — HYDROMORPHONE HCL 2 MG/ML IJ SOLN
0.5000 mg | INTRAMUSCULAR | Status: DC | PRN
  Administered 2016-08-09: 0.5 mg via INTRAVENOUS
  Administered 2016-08-10 – 2016-08-11 (×6): 1 mg via INTRAVENOUS
  Filled 2016-08-09 (×7): qty 1

## 2016-08-09 MED ORDER — 0.9 % SODIUM CHLORIDE (POUR BTL) OPTIME
TOPICAL | Status: DC | PRN
  Administered 2016-08-09: 1000 mL

## 2016-08-09 MED ORDER — CEFAZOLIN IN D5W 1 GM/50ML IV SOLN
1.0000 g | Freq: Four times a day (QID) | INTRAVENOUS | Status: AC
  Administered 2016-08-09 – 2016-08-10 (×3): 1 g via INTRAVENOUS
  Filled 2016-08-09 (×3): qty 50

## 2016-08-09 MED ORDER — HYDROMORPHONE HCL 2 MG/ML IJ SOLN: 0.2500 mg | INTRAMUSCULAR | Status: DC | PRN

## 2016-08-09 MED ORDER — FENTANYL CITRATE (PF) 100 MCG/2ML IJ SOLN
INTRAMUSCULAR | Status: AC
  Filled 2016-08-09: qty 2

## 2016-08-09 MED ORDER — SUGAMMADEX SODIUM 200 MG/2ML IV SOLN
INTRAVENOUS | Status: DC | PRN
  Administered 2016-08-09: 200 mg via INTRAVENOUS

## 2016-08-09 MED ORDER — LIDOCAINE HCL (CARDIAC) 20 MG/ML IV SOLN
INTRAVENOUS | Status: DC | PRN
  Administered 2016-08-09: 60 mg via INTRAVENOUS

## 2016-08-09 MED ORDER — ACETAMINOPHEN 10 MG/ML IV SOLN
INTRAVENOUS | Status: AC
  Filled 2016-08-09: qty 100

## 2016-08-09 MED ORDER — ONDANSETRON HCL 4 MG/2ML IJ SOLN: 4.0000 mg | Freq: Four times a day (QID) | INTRAMUSCULAR | Status: DC | PRN

## 2016-08-09 MED ORDER — POTASSIUM CHLORIDE IN NACL 20-0.9 MEQ/L-% IV SOLN
INTRAVENOUS | Status: DC
  Administered 2016-08-09 – 2016-08-10 (×2): via INTRAVENOUS
  Filled 2016-08-09 (×2): qty 1000

## 2016-08-09 MED ORDER — ACETAMINOPHEN 10 MG/ML IV SOLN
INTRAVENOUS | Status: DC | PRN
  Administered 2016-08-09: 1000 mg via INTRAVENOUS

## 2016-08-09 MED ORDER — ROCURONIUM BROMIDE 100 MG/10ML IV SOLN
INTRAVENOUS | Status: DC | PRN
  Administered 2016-08-09 (×2): 10 mg via INTRAVENOUS
  Administered 2016-08-09: 60 mg via INTRAVENOUS
  Administered 2016-08-09 (×3): 20 mg via INTRAVENOUS

## 2016-08-09 MED ORDER — LACTATED RINGERS IV SOLN
INTRAVENOUS | Status: DC
  Administered 2016-08-09 (×3): via INTRAVENOUS

## 2016-08-09 MED ORDER — ENOXAPARIN SODIUM 40 MG/0.4ML ~~LOC~~ SOLN
40.0000 mg | SUBCUTANEOUS | Status: DC
  Administered 2016-08-10 – 2016-08-14 (×5): 40 mg via SUBCUTANEOUS
  Filled 2016-08-09 (×5): qty 0.4

## 2016-08-09 MED ORDER — HYDROMORPHONE HCL 1 MG/ML IJ SOLN
INTRAMUSCULAR | Status: AC
  Administered 2016-08-09: 0.5 mg via INTRAVENOUS
  Filled 2016-08-09: qty 0.5

## 2016-08-09 MED ORDER — METOCLOPRAMIDE HCL 5 MG PO TABS: 5.0000 mg | ORAL_TABLET | Freq: Three times a day (TID) | ORAL | Status: DC | PRN

## 2016-08-09 MED ORDER — OXYCODONE-ACETAMINOPHEN 5-325 MG PO TABS
1.0000 | ORAL_TABLET | Freq: Four times a day (QID) | ORAL | Status: DC | PRN
  Administered 2016-08-09 – 2016-08-10 (×2): 2 via ORAL
  Filled 2016-08-09 (×2): qty 2

## 2016-08-09 MED ORDER — PHENYLEPHRINE HCL 10 MG/ML IJ SOLN
INTRAVENOUS | Status: DC | PRN
  Administered 2016-08-09: 20 ug/min via INTRAVENOUS

## 2016-08-09 MED ORDER — LACTATED RINGERS IV SOLN: INTRAVENOUS | Status: DC

## 2016-08-09 MED ORDER — METOCLOPRAMIDE HCL 5 MG/ML IJ SOLN: 5.0000 mg | Freq: Three times a day (TID) | INTRAMUSCULAR | Status: DC | PRN

## 2016-08-09 MED ORDER — PHENYLEPHRINE HCL 10 MG/ML IJ SOLN
INTRAMUSCULAR | Status: DC | PRN
  Administered 2016-08-09 (×2): 80 ug via INTRAVENOUS

## 2016-08-09 MED ORDER — DEXTROSE 5 % IV SOLN
500.0000 mg | Freq: Four times a day (QID) | INTRAVENOUS | Status: DC | PRN
  Filled 2016-08-09: qty 5

## 2016-08-09 SURGICAL SUPPLY — 135 items
APL SKNCLS STERI-STRIP NONHPOA (GAUZE/BANDAGES/DRESSINGS)
BANDAGE ACE 4X5 VEL STRL LF (GAUZE/BANDAGES/DRESSINGS) ×3 IMPLANT
BANDAGE ACE 6X5 VEL STRL LF (GAUZE/BANDAGES/DRESSINGS) ×1 IMPLANT
BANDAGE ESMARK 6X9 LF (GAUZE/BANDAGES/DRESSINGS) ×1 IMPLANT
BENZOIN TINCTURE PRP APPL 2/3 (GAUZE/BANDAGES/DRESSINGS) ×2 IMPLANT
BIT DRILL 110MM 85MM (BIT) IMPLANT
BIT DRILL 2.5X110 QC LCP DISP (BIT) ×2 IMPLANT
BIT DRILL QC 3.5X110 (BIT) ×2 IMPLANT
BIT DRILL QC PERC 2.8X200 (BIT) ×1
BIT DRILL QC PERC 2.8X200MM (BIT) IMPLANT
BLADE AVERAGE 25MMX9MM (BLADE)
BLADE AVERAGE 25X9 (BLADE) IMPLANT
BLADE SURG 10 STRL SS (BLADE) ×3 IMPLANT
BLADE SURG 15 STRL LF DISP TIS (BLADE) ×1 IMPLANT
BLADE SURG 15 STRL SS (BLADE) ×3
BLADE SURG ROTATE 9660 (MISCELLANEOUS) IMPLANT
BNDG CMPR 9X4 STRL LF SNTH (GAUZE/BANDAGES/DRESSINGS) ×2
BNDG CMPR 9X6 STRL LF SNTH (GAUZE/BANDAGES/DRESSINGS) ×1
BNDG COHESIVE 4X5 TAN STRL (GAUZE/BANDAGES/DRESSINGS) ×1 IMPLANT
BNDG ESMARK 4X9 LF (GAUZE/BANDAGES/DRESSINGS) ×5 IMPLANT
BNDG ESMARK 6X9 LF (GAUZE/BANDAGES/DRESSINGS) ×3
BNDG GAUZE ELAST 4 BULKY (GAUZE/BANDAGES/DRESSINGS) ×4 IMPLANT
BONE CANC CHIPS 20CC PCAN1/4 (Bone Implant) ×3 IMPLANT
BRUSH SCRUB DISP (MISCELLANEOUS) ×10 IMPLANT
CHIPS CANC BONE 20CC PCAN1/4 (Bone Implant) ×1 IMPLANT
CORDS BIPOLAR (ELECTRODE) ×3 IMPLANT
COVER MAYO STAND STRL (DRAPES) ×1 IMPLANT
COVER SURGICAL LIGHT HANDLE (MISCELLANEOUS) ×4 IMPLANT
DRAIN PENROSE 1/4X12 LTX STRL (WOUND CARE) ×1 IMPLANT
DRAPE C-ARM 42X72 X-RAY (DRAPES) ×3 IMPLANT
DRAPE C-ARMOR (DRAPES) ×1 IMPLANT
DRAPE HALF SHEET 40X57 (DRAPES) ×1 IMPLANT
DRAPE IMP U-DRAPE 54X76 (DRAPES) ×3 IMPLANT
DRAPE INCISE IOBAN 66X45 STRL (DRAPES) ×1 IMPLANT
DRAPE ORTHO SPLIT 77X108 STRL (DRAPES) ×6
DRAPE SURG 17X11 SM STRL (DRAPES) ×6 IMPLANT
DRAPE SURG ORHT 6 SPLT 77X108 (DRAPES) IMPLANT
DRAPE U-SHAPE 47X51 STRL (DRAPES) ×6 IMPLANT
DRILL BIT 110MM/85MM (BIT) ×2
DRILL BIT 2.5X100 214235007 (MISCELLANEOUS) ×2 IMPLANT
DRILL BIT 2.7X100 214235006 DU (MISCELLANEOUS) ×2 IMPLANT
DRILL BIT QC PERC 2.8X200MM (BIT) ×3
DRSG ADAPTIC 3X8 NADH LF (GAUZE/BANDAGES/DRESSINGS) ×1 IMPLANT
DRSG MEPILEX BORDER 4X8 (GAUZE/BANDAGES/DRESSINGS) ×2 IMPLANT
DRSG MEPITEL 4X7.2 (GAUZE/BANDAGES/DRESSINGS) ×2 IMPLANT
DRSG PAD ABDOMINAL 8X10 ST (GAUZE/BANDAGES/DRESSINGS) ×6 IMPLANT
ELECT REM PT RETURN 9FT ADLT (ELECTROSURGICAL) ×3
ELECTRODE REM PT RTRN 9FT ADLT (ELECTROSURGICAL) ×1 IMPLANT
EVACUATOR 1/8 PVC DRAIN (DRAIN) IMPLANT
EVACUATOR 3/16  PVC DRAIN (DRAIN)
EVACUATOR 3/16 PVC DRAIN (DRAIN) IMPLANT
GAUZE SPONGE 4X4 12PLY STRL (GAUZE/BANDAGES/DRESSINGS) ×4 IMPLANT
GLOVE BIO SURGEON STRL SZ7.5 (GLOVE) ×3 IMPLANT
GLOVE BIO SURGEON STRL SZ8 (GLOVE) ×3 IMPLANT
GLOVE BIOGEL PI IND STRL 7.5 (GLOVE) ×1 IMPLANT
GLOVE BIOGEL PI IND STRL 8 (GLOVE) ×1 IMPLANT
GLOVE BIOGEL PI INDICATOR 7.5 (GLOVE) ×2
GLOVE BIOGEL PI INDICATOR 8 (GLOVE) ×2
GLOVE PROGUARD SZ 7 1/2 (GLOVE) ×3 IMPLANT
GOWN STRL REUS W/ TWL LRG LVL3 (GOWN DISPOSABLE) ×2 IMPLANT
GOWN STRL REUS W/ TWL XL LVL3 (GOWN DISPOSABLE) ×1 IMPLANT
GOWN STRL REUS W/TWL LRG LVL3 (GOWN DISPOSABLE) ×6
GOWN STRL REUS W/TWL XL LVL3 (GOWN DISPOSABLE) ×6
GRAFT BNE CANC CHIPS 1-8 20CC (Bone Implant) IMPLANT
IMMOBILIZER KNEE 22 (SOFTGOODS) ×2 IMPLANT
IMMOBILIZER KNEE 22 UNIV (SOFTGOODS) ×1 IMPLANT
KIT BASIN OR (CUSTOM PROCEDURE TRAY) ×3 IMPLANT
KIT ROOM TURNOVER OR (KITS) ×3 IMPLANT
MANIFOLD NEPTUNE II (INSTRUMENTS) ×3 IMPLANT
NDL HYPO 25X1 1.5 SAFETY (NEEDLE) ×1 IMPLANT
NDL SUT .5 MAYO 1.404X.05X (NEEDLE) IMPLANT
NDL SUT 6 .5 CRC .975X.05 MAYO (NEEDLE) ×1 IMPLANT
NEEDLE 22X1 1/2 (OR ONLY) (NEEDLE) IMPLANT
NEEDLE HYPO 25X1 1.5 SAFETY (NEEDLE) ×3 IMPLANT
NEEDLE MAYO TAPER (NEEDLE) ×3
NS IRRIG 1000ML POUR BTL (IV SOLUTION) ×3 IMPLANT
PACK ORTHO EXTREMITY (CUSTOM PROCEDURE TRAY) ×3 IMPLANT
PACK TOTAL JOINT (CUSTOM PROCEDURE TRAY) ×3 IMPLANT
PACK UNIVERSAL I (CUSTOM PROCEDURE TRAY) ×3 IMPLANT
PAD ARMBOARD 7.5X6 YLW CONV (MISCELLANEOUS) ×6 IMPLANT
PAD CAST 4YDX4 CTTN HI CHSV (CAST SUPPLIES) ×1 IMPLANT
PADDING CAST COTTON 4X4 STRL (CAST SUPPLIES)
PADDING CAST COTTON 6X4 STRL (CAST SUPPLIES) ×1 IMPLANT
PLATE LC DCP 2.0 6H (Plate) ×2 IMPLANT
PLATE LOCK 3H STD RT PROX TIB (Plate) ×2 IMPLANT
PROS LCP PLATE 10 137M (Plate) ×3 IMPLANT
PROSTHESIS LCP PLATE 10 137M (Plate) IMPLANT
SCREW CORTEX 2.0 22MM (Screw) ×2 IMPLANT
SCREW CORTEX 3.5 20MM (Screw) ×2 IMPLANT
SCREW CORTEX 3.5 22MM (Screw) ×2 IMPLANT
SCREW CORTEX 3.5 24MM (Screw) ×2 IMPLANT
SCREW CORTEX 3.5 28MM (Screw) ×4 IMPLANT
SCREW CORTEX ST 2.0X20 (Screw) ×2 IMPLANT
SCREW CORTICAL 3.5MM 38MM (Screw) ×2 IMPLANT
SCREW LOCK CORT ST 3.5X20 (Screw) IMPLANT
SCREW LOCK CORT ST 3.5X22 (Screw) IMPLANT
SCREW LOCK CORT ST 3.5X24 (Screw) IMPLANT
SCREW LOCK CORT ST 3.5X28 (Screw) IMPLANT
SCREW LOCK CORT STAR 3.5X60 (Screw) ×2 IMPLANT
SCREW LOCK CORT STAR 3.5X65 (Screw) ×4 IMPLANT
SCREW LOCK CORT STAR 3.5X70 (Screw) ×4 IMPLANT
SCREW LOCK T15 FT 24X3.5X2.9X (Screw) IMPLANT
SCREW LOCK T15 FT 28X3.5X2.9X (Screw) IMPLANT
SCREW LOCKING 3.5X24 (Screw) ×6 IMPLANT
SCREW LOCKING 3.5X28 (Screw) ×3 IMPLANT
SCREW LP 3.5X65MM (Screw) ×4 IMPLANT
SLING ARM FOAM STRAP LRG (SOFTGOODS) ×2 IMPLANT
SPONGE GAUZE 4X4 12PLY STER LF (GAUZE/BANDAGES/DRESSINGS) ×2 IMPLANT
SPONGE LAP 18X18 X RAY DECT (DISPOSABLE) ×1 IMPLANT
STAPLER VISISTAT 35W (STAPLE) ×3 IMPLANT
STOCKINETTE IMPERVIOUS 9X36 MD (GAUZE/BANDAGES/DRESSINGS) ×2 IMPLANT
STOCKINETTE IMPERVIOUS LG (DRAPES) ×1 IMPLANT
SUCTION FRAZIER HANDLE 10FR (MISCELLANEOUS) ×2
SUCTION TUBE FRAZIER 10FR DISP (MISCELLANEOUS) ×1 IMPLANT
SUT ETHIBOND 5 LR DA (SUTURE) ×3 IMPLANT
SUT ETHILON 2 0 PSLX (SUTURE) ×4 IMPLANT
SUT ETHILON 3 0 PS 1 (SUTURE) IMPLANT
SUT PROLENE 0 CT 2 (SUTURE) ×6 IMPLANT
SUT VIC AB 0 CT1 27 (SUTURE) ×6
SUT VIC AB 0 CT1 27XBRD ANBCTR (SUTURE) ×2 IMPLANT
SUT VIC AB 1 CT1 27 (SUTURE) ×6
SUT VIC AB 1 CT1 27XBRD ANBCTR (SUTURE) ×1 IMPLANT
SUT VIC AB 2-0 CT1 27 (SUTURE) ×6
SUT VIC AB 2-0 CT1 TAPERPNT 27 (SUTURE) ×2 IMPLANT
SYR 20ML ECCENTRIC (SYRINGE) IMPLANT
SYR 5ML LL (SYRINGE) IMPLANT
SYR CONTROL 10ML LL (SYRINGE) ×1 IMPLANT
TOWEL OR 17X24 6PK STRL BLUE (TOWEL DISPOSABLE) ×3 IMPLANT
TOWEL OR 17X26 10 PK STRL BLUE (TOWEL DISPOSABLE) ×9 IMPLANT
TRAY FOLEY CATH 16FRSI W/METER (SET/KITS/TRAYS/PACK) IMPLANT
TUBE CONNECTING 12'X1/4 (SUCTIONS) ×1
TUBE CONNECTING 12X1/4 (SUCTIONS) ×2 IMPLANT
WATER STERILE IRR 1000ML POUR (IV SOLUTION) ×2 IMPLANT
WIRE K 1.6MM 144256 (MISCELLANEOUS) ×4 IMPLANT
YANKAUER SUCT BULB TIP NO VENT (SUCTIONS) ×1 IMPLANT
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
## 2016-08-09 NOTE — Anesthesia Preprocedure Evaluation (Signed)
Anesthesia Evaluation  Patient identified by MRN, date of birth, ID band Patient awake    Reviewed: Allergy & Precautions, NPO status , Patient's Chart, lab work & pertinent test results  Airway      Mouth opening: Limited Mouth Opening  Dental  (+) Teeth Intact, Dental Advisory Given   Pulmonary Current Smoker,    breath sounds clear to auscultation       Cardiovascular negative cardio ROS   Rhythm:Regular Rate:Normal     Neuro/Psych  C-spine not cleared negative neurological ROS     GI/Hepatic negative GI ROS, Neg liver ROS,   Endo/Other  negative endocrine ROS  Renal/GU negative Renal ROS  negative genitourinary   Musculoskeletal negative musculoskeletal ROS (+)   Abdominal   Peds negative pediatric ROS (+)  Hematology negative hematology ROS (+)   Anesthesia Other Findings   Reproductive/Obstetrics negative OB ROS                             Anesthesia Physical Anesthesia Plan  ASA: II  Anesthesia Plan: General   Post-op Pain Management:    Induction: Intravenous  Airway Management Planned: Oral ETT  Additional Equipment:   Intra-op Plan:   Post-operative Plan: Extubation in OR  Informed Consent: I have reviewed the patients History and Physical, chart, labs and discussed the procedure including the risks, benefits and alternatives for the proposed anesthesia with the patient or authorized representative who has indicated his/her understanding and acceptance.   Dental advisory given  Plan Discussed with: CRNA  Anesthesia Plan Comments: (Unable to safely perform regional blocks secondary to abrasions and splints. )        Anesthesia Quick Evaluation

## 2016-08-09 NOTE — Transfer of Care (Signed)
Immediate Anesthesia Transfer of Care Note  Patient: Steven [name]  Procedure(s) Performed: Procedure(s): OPEN REDUCTION INTERNAL FIXATION (ORIF) DISTAL HUMERUS FRACTURE (Right) OPEN REDUCTION INTERNAL FIXATION (ORIF) TIBIA PLATEAU  FRACTURE (Right)  Patient Location: PACU  Anesthesia Type:General  Level of Consciousness: awake, alert , oriented and patient cooperative  Airway & Oxygen Therapy: Patient Spontanous Breathing and Patient connected to face mask oxygen  Post-op Assessment: Report given to RN and Post -op Vital signs reviewed and stable  Post vital signs: Reviewed and stable  Last Vitals:  Vitals:   08/09/16 0958 08/09/16 1745  BP: (!) 133/53   Pulse: 76   Resp: 18   Temp: 37.1 C (P) 37.6 C    Last Pain:  Vitals:   08/09/16 0958  TempSrc: Oral  PainSc:          Complications: No apparent anesthesia complications

## 2016-08-09 NOTE — Anesthesia Procedure Notes (Signed)
Procedure Name: Intubation Date/Time: 08/09/2016 1:12 PM Performed by: Tillman AbideHAWKINS, Steven Woody B Pre-anesthesia Checklist: Patient identified, Emergency Drugs available, Suction available and Patient being monitored Patient Re-evaluated:Patient Re-evaluated prior to inductionOxygen Delivery Method: Circle System Utilized Preoxygenation: Pre-oxygenation with 100% oxygen Intubation Type: IV induction Ventilation: Mask ventilation without difficulty Laryngoscope Size: Glidescope and 3 Grade View: Grade I Tube type: Oral Number of attempts: 1 Airway Equipment and Method: Stylet and Oral airway Placement Confirmation: ETT inserted through vocal cords under direct vision,  positive ETCO2 and breath sounds checked- equal and bilateral Secured at: 22 cm Tube secured with: Tape Dental Injury: Teeth and Oropharynx as per pre-operative assessment  Comments: Front of C-collar removed for intubation.  Inline stabilization provided throughout glide scope intubation with no flexion/extension of neck.  Collar reapplied after intubation

## 2016-08-09 NOTE — Progress Notes (Signed)
Orthopedic Tech Progress Note Patient Details:  Steven [name] 11/27/1990 [id number]  Patient ID: Steven [name], male   DOB: 05/19/1991, 25 y.o.   MRN: [id number] [facility]  [name], [name] 08/09/2016, 8:27 PM

## 2016-08-09 NOTE — Progress Notes (Signed)
OT Cancellation Note  Patient Details Name: Marianna FussRicky A Colpitts MRN: [id number] DOB: 05/08/1991   Cancelled Treatment:    Reason Eval/Treat Not Completed: Patient at procedure or test/ unavailable. Pt in surgery today. Will check back tomorrow. Thank you.  Evern BioLaura J Hilari Wethington 08/09/2016, 3:22 PM  Sherryl MangesLaura Osmani Kersten OTR/L [phone]

## 2016-08-09 NOTE — Brief Op Note (Signed)
08/06/2016 - 08/09/2016  5:27 PM  PATIENT:  Steven [name]  25 y.o. male  PRE-OPERATIVE DIAGNOSIS:  1. RIGHT COMMINUTED HUMERAL SHAFT FRACTURE 2. RIGHT LATERAL TIBIAL PLATEAU FRACTURE 3. RIGHT TIBIAL EMINENCE FRACTURE  POST-OPERATIVE DIAGNOSIS 1. RIGHT COMMINUTED HUMERAL SHAFT FRACTURE 2. RIGHT LATERAL TIBIAL PLATEAU FRACTURE 3. RIGHT TIBIAL EMINENCE FRACTURE 4. LATERAL MENISCUS COMPLETE RADIAL TEAR AND AVULSION  PROCEDURE:  Procedure(s): 1. OPEN REDUCTION INTERNAL FIXATION (ORIF) HUMERAL SHAFT FRACTURE (Right) 2. OPEN REDUCTION INTERNAL FIXATION (ORIF) LATERAL TIBIAL PLATEAU  FRACTURE (Right) 3. CLOSED TREATMENT TIBIAL EMINENCE, IRRIGATION AND EVALUATION WITHOUT REPAIR 4. REPAIR OF LATERAL MENISCUS AND PARTIAL LATERAL MENISCECTOMY 5. ANTERIOR COMPARTMENT FASCIOTOMY  SURGEON:  Surgeon(s) and Role:    * Myrene GalasMichael Masami Plata, MD - Primary  PHYSICIAN ASSISTANT: Montez MoritaKEITH PAUL, PAC  ANESTHESIA:   general  EBL:  Total I/O In: 1000 [I.V.:1000] Out: 975 [Urine:800; Blood:175]  BLOOD ADMINISTERED:none  DRAINS: none   LOCAL MEDICATIONS USED:  NONE  SPECIMEN:  No Specimen  DISPOSITION OF SPECIMEN:  N/A  COUNTS:  YES  TOURNIQUET:   Total Tourniquet Time Documented: Thigh (Right) - 77 minutes Total: Thigh (Right) - 77 minutes   DICTATION: .Other Dictation: Dictation Number [id number]  PLAN OF CARE: Admit to inpatient   PATIENT DISPOSITION:  PACU - hemodynamically stable.   Delay start of Pharmacological VTE agent (>24hrs) due to surgical blood loss or risk of bleeding: no
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
## 2016-08-10 LAB — CBC
HEMATOCRIT: 23.4 % — AB (ref 39.0–52.0)
HEMOGLOBIN: 8 g/dL — AB (ref 13.0–17.0)
MCH: 29 pg (ref 26.0–34.0)
MCHC: 34.2 g/dL (ref 30.0–36.0)
MCV: 84.8 fL (ref 78.0–100.0)
Platelets: 119 10*3/uL — ABNORMAL LOW (ref 150–400)
RBC: 2.76 MIL/uL — AB (ref 4.22–5.81)
RDW: 12.5 % (ref 11.5–15.5)
WBC: 11.1 10*3/uL — AB (ref 4.0–10.5)

## 2016-08-10 LAB — BASIC METABOLIC PANEL
ANION GAP: 5 (ref 5–15)
BUN: 9 mg/dL (ref 6–20)
CHLORIDE: 99 mmol/L — AB (ref 101–111)
CO2: 28 mmol/L (ref 22–32)
CREATININE: 0.71 mg/dL (ref 0.61–1.24)
Calcium: 8.7 mg/dL — ABNORMAL LOW (ref 8.9–10.3)
GFR calc non Af Amer: >60 mL/min (ref >60)
Glucose, Bld: 134 mg/dL — ABNORMAL HIGH (ref 65–99)
POTASSIUM: 4.2 mmol/L (ref 3.5–5.1)
SODIUM: 132 mmol/L — AB (ref 135–145)

## 2016-08-10 NOTE — Progress Notes (Signed)
Orthopedic Tech Progress Note Patient Details:  Marianna FussRicky A Krohn 01/26/1991 [id number]  Patient ID: Marianna Fussicky A Dolinger, male   DOB: 09/28/1991, 25 y.o.   MRN: [id number]   Saul FordyceJennifer C Deborra Phegley 08/10/2016, 10:48 AMCalled [facility] for Right Hinged knee brace and Hard collar.

## 2016-08-10 NOTE — Progress Notes (Signed)
Inpatient Rehabilitation  PT is recommending IP Rehab.  At this time, we are recommending and IP Rehab consult.  Please order if you are agreeable.    Weldon PickingSusan Imberly Troxler PT Inpatient Rehab Admissions Coordinator Cell [phone] Office [phone]

## 2016-08-10 NOTE — Progress Notes (Signed)
Subjective: 1 Day Post-Op Procedure(s) (LRB): OPEN REDUCTION INTERNAL FIXATION (ORIF) DISTAL HUMERUS FRACTURE (Right) OPEN REDUCTION INTERNAL FIXATION (ORIF) TIBIA PLATEAU  FRACTURE (Right) Patient reports pain as moderate.    Objective: Vital signs in last 24 hours: Temp:  [98.4 F (36.9 C)-99.7 F (37.6 C)] 98.6 F (37 C) (11/04 0615) Pulse Rate:  [76-110] 81 (11/04 0615) Resp:  [10-21] 21 (11/04 0615) BP: (133-171)/(53-81) 160/65 (11/04 0615) SpO2:  [97 %-100 %] 100 % (11/04 0615)  Intake/Output from previous day: 11/03 0701 - 11/04 0700 In: 3356.5 [P.O.:360; I.V.:2996.5] Out: 2950 [Urine:2775; Blood:175] Intake/Output this shift: No intake/output data recorded.   Recent Labs  08/08/16 0822 08/09/16 0446 08/10/16 0400  HGB 9.9* 9.2* 8.0*    Recent Labs  08/09/16 0446 08/10/16 0400  WBC 10.7* 11.1*  RBC 3.14* 2.76*  HCT 27.0* 23.4*  PLT 113* 119*    Recent Labs  08/09/16 0458 08/10/16 0400  NA 135 132*  K 3.9 4.2  CL 101 99*  CO2 29 28  BUN 7 9  CREATININE 0.69 0.71  GLUCOSE 115* 134*  CALCIUM 8.6* 8.7*    Recent Labs  08/08/16 1607  INR 1.19    Neurovascular intact Sensation intact distally Intact pulses distally Dorsiflexion/Plantar flexion intact Incision: dressing C/D/I No cellulitis present Compartment soft  Assessment/Plan: 1 Day Post-Op Procedure(s) (LRB): OPEN REDUCTION INTERNAL FIXATION (ORIF) DISTAL HUMERUS FRACTURE (Right) OPEN REDUCTION INTERNAL FIXATION (ORIF) TIBIA PLATEAU  FRACTURE (Right) Up with therapy  WBAT RUE NWB RLE hinged knee brace Pain control as ordered Dressing change prn dvt proph lovenox  Steven [name] 08/10/2016, 8:45 AM
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
## 2016-08-11 LAB — BASIC METABOLIC PANEL
ANION GAP: 8 (ref 5–15)
BUN: 10 mg/dL (ref 6–20)
CALCIUM: 8.7 mg/dL — AB (ref 8.9–10.3)
CO2: 28 mmol/L (ref 22–32)
Chloride: 99 mmol/L — ABNORMAL LOW (ref 101–111)
Creatinine, Ser: 0.61 mg/dL (ref 0.61–1.24)
GFR calc Af Amer: >60 mL/min (ref >60)
GLUCOSE: 107 mg/dL — AB (ref 65–99)
Potassium: 4 mmol/L (ref 3.5–5.1)
Sodium: 135 mmol/L (ref 135–145)

## 2016-08-11 LAB — CBC
HCT: 22.6 % — ABNORMAL LOW (ref 39.0–52.0)
HEMOGLOBIN: 7.8 g/dL — AB (ref 13.0–17.0)
MCH: 29.5 pg (ref 26.0–34.0)
MCHC: 34.5 g/dL (ref 30.0–36.0)
MCV: 85.6 fL (ref 78.0–100.0)
Platelets: 172 10*3/uL (ref 150–400)
RBC: 2.64 MIL/uL — ABNORMAL LOW (ref 4.22–5.81)
RDW: 13.1 % (ref 11.5–15.5)
WBC: 13.8 10*3/uL — ABNORMAL HIGH (ref 4.0–10.5)

## 2016-08-11 NOTE — Progress Notes (Signed)
Orthopedic Tech Progress Note Patient Details:  [name] 05/17/1991 [id number]  Ortho Devices Type of Ortho Device: Arm sling Ortho Device/Splint Location: Replacement arm sling Ortho Device/Splint Interventions: Ordered, Application   Saul FordyceJennifer C Arhum Peeples 08/11/2016, 4:31 PM
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
## 2016-08-11 NOTE — Progress Notes (Signed)
Subjective: 2 Days Post-Op Procedure(s) (LRB): OPEN REDUCTION INTERNAL FIXATION (ORIF) DISTAL HUMERUS FRACTURE (Right) OPEN REDUCTION INTERNAL FIXATION (ORIF) TIBIA PLATEAU  FRACTURE (Right) Patient reports pain as mild and moderate.  C/o some stiffness in the right hand and fingers.  Objective: Vital signs in last 24 hours: Temp:  [98.6 F (37 C)-99.1 F (37.3 C)] 98.6 F (37 C) (11/05 0440) Pulse Rate:  [78-88] 80 (11/05 0440) Resp:  [18-20] 18 (11/05 0440) BP: (146-182)/(58-66) 146/64 (11/05 0440) SpO2:  [99 %-100 %] 99 % (11/05 0440)  Intake/Output from previous day: 11/04 0701 - 11/05 0700 In: 360 [P.O.:360] Out: 2075 [Urine:2075] Intake/Output this shift: Total I/O In: -  Out: 600 [Urine:600]   Recent Labs  08/09/16 0446 08/10/16 0400 08/11/16 0408  HGB 9.2* 8.0* 7.8*    Recent Labs  08/10/16 0400 08/11/16 0408  WBC 11.1* 13.8*  RBC 2.76* 2.64*  HCT 23.4* 22.6*  PLT 119* 172    Recent Labs  08/10/16 0400 08/11/16 0408  NA 132* 135  K 4.2 4.0  CL 99* 99*  CO2 28 28  BUN 9 10  CREATININE 0.71 0.61  GLUCOSE 134* 107*  CALCIUM 8.7* 8.7*    Recent Labs  08/08/16 1607  INR 1.19    Neurovascular intact Sensation intact distally Intact pulses distally Dorsiflexion/Plantar flexion intact Incision: dressing C/D/I No cellulitis present Compartment soft  Mild swelling R hand recommended some active ROM exercises to help reduce swelling  Assessment/Plan: 2 Days Post-Op Procedure(s) (LRB): OPEN REDUCTION INTERNAL FIXATION (ORIF) DISTAL HUMERUS FRACTURE (Right) OPEN REDUCTION INTERNAL FIXATION (ORIF) TIBIA PLATEAU  FRACTURE (Right)   Up with therapy  WBAT RUE NWB RLE hinged knee brace Pain control as ordered Dressing change prn dvt proph lovenox Feel pt with upper and lower extremity injuries would be a good candidate for inpatient rehab will defer to primary team for decision.  Margart SicklesChadwell, Laiklyn Pilkenton 08/11/2016, 10:27 AM
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
## 2016-08-12 DIAGNOSIS — G8918 Other acute postprocedural pain: Secondary | ICD-10-CM

## 2016-08-12 DIAGNOSIS — T07XXXA Unspecified multiple injuries, initial encounter: Secondary | ICD-10-CM

## 2016-08-12 DIAGNOSIS — Z419 Encounter for procedure for purposes other than remedying health state, unspecified: Secondary | ICD-10-CM

## 2016-08-12 DIAGNOSIS — E46 Unspecified protein-calorie malnutrition: Secondary | ICD-10-CM

## 2016-08-12 DIAGNOSIS — S22009D Unspecified fracture of unspecified thoracic vertebra, subsequent encounter for fracture with routine healing: Secondary | ICD-10-CM

## 2016-08-12 DIAGNOSIS — D72820 Lymphocytosis (symptomatic): Secondary | ICD-10-CM

## 2016-08-12 DIAGNOSIS — S32810D Multiple fractures of pelvis with stable disruption of pelvic ring, subsequent encounter for fracture with routine healing: Secondary | ICD-10-CM

## 2016-08-12 DIAGNOSIS — S060X1D Concussion with loss of consciousness of 30 minutes or less, subsequent encounter: Secondary | ICD-10-CM

## 2016-08-12 DIAGNOSIS — D62 Acute posthemorrhagic anemia: Secondary | ICD-10-CM

## 2016-08-12 DIAGNOSIS — S82141A Displaced bicondylar fracture of right tibia, initial encounter for closed fracture: Secondary | ICD-10-CM

## 2016-08-12 DIAGNOSIS — K5903 Drug induced constipation: Secondary | ICD-10-CM

## 2016-08-12 DIAGNOSIS — M792 Neuralgia and neuritis, unspecified: Secondary | ICD-10-CM

## 2016-08-12 DIAGNOSIS — S129XXD Fracture of neck, unspecified, subsequent encounter: Secondary | ICD-10-CM

## 2016-08-12 DIAGNOSIS — S32810A Multiple fractures of pelvis with stable disruption of pelvic ring, initial encounter for closed fracture: Secondary | ICD-10-CM

## 2016-08-12 DIAGNOSIS — Z720 Tobacco use: Secondary | ICD-10-CM

## 2016-08-12 DIAGNOSIS — S82141D Displaced bicondylar fracture of right tibia, subsequent encounter for closed fracture with routine healing: Secondary | ICD-10-CM

## 2016-08-12 LAB — CBC
HCT: 24.2 % — ABNORMAL LOW (ref 39.0–52.0)
Hemoglobin: 8.2 g/dL — ABNORMAL LOW (ref 13.0–17.0)
MCH: 29.2 pg (ref 26.0–34.0)
MCHC: 33.9 g/dL (ref 30.0–36.0)
MCV: 86.1 fL (ref 78.0–100.0)
Platelets: 204 K/uL (ref 150–400)
RBC: 2.81 MIL/uL — ABNORMAL LOW (ref 4.22–5.81)
RDW: 13.1 % (ref 11.5–15.5)
WBC: 11.2 K/uL — ABNORMAL HIGH (ref 4.0–10.5)

## 2016-08-12 LAB — COMPREHENSIVE METABOLIC PANEL WITH GFR
ALT: 89 U/L — ABNORMAL HIGH (ref 17–63)
AST: 142 U/L — ABNORMAL HIGH (ref 15–41)
Albumin: 3.1 g/dL — ABNORMAL LOW (ref 3.5–5.0)
Alkaline Phosphatase: 38 U/L (ref 38–126)
Anion gap: 8 (ref 5–15)
BUN: 8 mg/dL (ref 6–20)
CO2: 30 mmol/L (ref 22–32)
Calcium: 9.1 mg/dL (ref 8.9–10.3)
Chloride: 97 mmol/L — ABNORMAL LOW (ref 101–111)
Creatinine, Ser: 0.61 mg/dL (ref 0.61–1.24)
GFR calc Af Amer: >60 mL/min
GFR calc non Af Amer: >60 mL/min
Glucose, Bld: 103 mg/dL — ABNORMAL HIGH (ref 65–99)
Potassium: 3.9 mmol/L (ref 3.5–5.1)
Sodium: 135 mmol/L (ref 135–145)
Total Bilirubin: 1 mg/dL (ref 0.3–1.2)
Total Protein: 5.9 g/dL — ABNORMAL LOW (ref 6.5–8.1)

## 2016-08-12 MED ORDER — HYDROMORPHONE HCL 1 MG/ML IJ SOLN
0.5000 mg | INTRAMUSCULAR | Status: DC | PRN
  Administered 2016-08-13: 0.5 mg via INTRAVENOUS
  Filled 2016-08-12: qty 0.5

## 2016-08-12 MED ORDER — TRAMADOL HCL 50 MG PO TABS
100.0000 mg | ORAL_TABLET | Freq: Four times a day (QID) | ORAL | Status: DC
  Administered 2016-08-12 – 2016-08-14 (×11): 100 mg via ORAL
  Filled 2016-08-12 (×12): qty 2

## 2016-08-12 MED ORDER — OXYCODONE HCL 5 MG PO TABS
10.0000 mg | ORAL_TABLET | ORAL | Status: DC | PRN
  Administered 2016-08-12 – 2016-08-14 (×7): 20 mg via ORAL
  Filled 2016-08-12 (×7): qty 4

## 2016-08-12 NOTE — Progress Notes (Signed)
Orthopedic Tech Progress Note Patient Details:  Steven [name] 10/16/1990 [id number]  Ortho Devices Type of Ortho Device: Arm sling Ortho Device/Splint Location: Trapeze bar Ortho Device/Splint Interventions: Application   Saul FordyceJennifer C Korrin Waterfield 08/12/2016, 9:02 PM
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
## 2016-08-12 NOTE — Anesthesia Postprocedure Evaluation (Signed)
Anesthesia Post Note  Patient: Marianna FussRicky A Hamlett  Procedure(s) Performed: Procedure(s) (LRB): OPEN REDUCTION INTERNAL FIXATION (ORIF) DISTAL HUMERUS FRACTURE (Right) OPEN REDUCTION INTERNAL FIXATION (ORIF) TIBIA PLATEAU  FRACTURE (Right)  Patient location during evaluation: PACU Anesthesia Type: General Level of consciousness: awake and alert Pain management: pain level controlled Vital Signs Assessment: post-procedure vital signs reviewed and stable Respiratory status: spontaneous breathing, nonlabored ventilation, respiratory function stable and patient connected to nasal cannula oxygen Cardiovascular status: blood pressure returned to baseline and stable Postop Assessment: no signs of nausea or vomiting Anesthetic complications: no    Last Vitals:  Vitals:   08/11/16 2106 08/12/16 0538  BP: 132/69 (!) 139/53  Pulse: 85 83  Resp: 18 16  Temp: 37.2 C 37.2 C    Last Pain:  Vitals:   08/12/16 0721  TempSrc:   PainSc: Asleep                 Shelton SilvasKevin D Cleavon Goldman
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
## 2016-08-12 NOTE — Progress Notes (Signed)
Rehab admissions - Please see rehab consult done today by Dr. Allena KatzPatel.  Once patient can tolerate therapies and pain is under better control, then can consider for acute inpatient rehab admission.  Call me for questions.  #[phone]
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
## 2016-08-13 ENCOUNTER — Encounter (HOSPITAL_COMMUNITY): Payer: Self-pay | Admitting: Orthopedic Surgery

## 2016-08-13 MED ORDER — BETHANECHOL CHLORIDE 25 MG PO TABS
25.0000 mg | ORAL_TABLET | Freq: Four times a day (QID) | ORAL | Status: DC
  Administered 2016-08-13 – 2016-08-14 (×7): 25 mg via ORAL
  Filled 2016-08-13 (×7): qty 1
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
## 2016-08-13 NOTE — Progress Notes (Signed)
Orthopedic Tech Progress Note Patient Details:  Steven [name] 06/17/1991 [id number]  Ortho Devices Type of Ortho Device: Thumb velcro splint Ortho Device/Splint Location: Rt arm Ortho Device/Splint Interventions: Application   [name] 08/13/2016, 11:48 AM
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
## 2016-08-13 NOTE — Progress Notes (Signed)
Rehab admissions - Noted pain control is better and patient tolerated therapy better this pm.  I will talk with patient tomorrow and potentially admit to acute inpatient rehab if patient is agreeable.  Call me for questions.  #[phone]

## 2016-08-13 NOTE — Progress Notes (Signed)
Pt unable to urinate. Bladder scan shows 660ml. Made Dr. Dwain SarnaWakefield aware. Ordered in and out cath x1. Pt refused and tried to urinate again. Pt able to urinate 400ml out. Will continue to monitor.
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
## 2016-08-14 ENCOUNTER — Inpatient Hospital Stay (HOSPITAL_COMMUNITY)
Admission: RE | Admit: 2016-08-14 | Discharge: 2016-08-22 | DRG: 103 | Disposition: A | Discharge status: Rehabilitation Facility | Payer: No Typology Code available for payment source | Source: Intra-hospital | Attending: Physical Medicine & Rehabilitation | Admitting: Physical Medicine & Rehabilitation

## 2016-08-14 ENCOUNTER — Encounter (HOSPITAL_COMMUNITY): Payer: Self-pay | Admitting: *Deleted

## 2016-08-14 ENCOUNTER — Inpatient Hospital Stay (HOSPITAL_COMMUNITY)
Admission: RE | Admit: 2016-08-14 | Payer: No Typology Code available for payment source | Source: Intra-hospital | Admitting: Physical Medicine & Rehabilitation

## 2016-08-14 DIAGNOSIS — S32810S Multiple fractures of pelvis with stable disruption of pelvic ring, sequela: Secondary | ICD-10-CM

## 2016-08-14 DIAGNOSIS — R339 Retention of urine, unspecified: Secondary | ICD-10-CM

## 2016-08-14 DIAGNOSIS — K5900 Constipation, unspecified: Secondary | ICD-10-CM | POA: Diagnosis present

## 2016-08-14 DIAGNOSIS — F0781 Postconcussional syndrome: Principal | ICD-10-CM | POA: Diagnosis present

## 2016-08-14 DIAGNOSIS — S4411XA Injury of median nerve at upper arm level, right arm, initial encounter: Secondary | ICD-10-CM

## 2016-08-14 DIAGNOSIS — S82141S Displaced bicondylar fracture of right tibia, sequela: Secondary | ICD-10-CM

## 2016-08-14 DIAGNOSIS — S5411XD Injury of median nerve at forearm level, right arm, subsequent encounter: Secondary | ICD-10-CM

## 2016-08-14 DIAGNOSIS — F17290 Nicotine dependence, other tobacco product, uncomplicated: Secondary | ICD-10-CM | POA: Diagnosis present

## 2016-08-14 DIAGNOSIS — S42351D Displaced comminuted fracture of shaft of humerus, right arm, subsequent encounter for fracture with routine healing: Secondary | ICD-10-CM | POA: Diagnosis not present

## 2016-08-14 DIAGNOSIS — S02113D Unspecified occipital condyle fracture, subsequent encounter for fracture with routine healing: Secondary | ICD-10-CM | POA: Diagnosis not present

## 2016-08-14 DIAGNOSIS — D72829 Elevated white blood cell count, unspecified: Secondary | ICD-10-CM | POA: Diagnosis present

## 2016-08-14 DIAGNOSIS — S129XXS Fracture of neck, unspecified, sequela: Secondary | ICD-10-CM

## 2016-08-14 DIAGNOSIS — D62 Acute posthemorrhagic anemia: Secondary | ICD-10-CM | POA: Diagnosis present

## 2016-08-14 DIAGNOSIS — S32591D Other specified fracture of right pubis, subsequent encounter for fracture with routine healing: Secondary | ICD-10-CM

## 2016-08-14 DIAGNOSIS — S82141D Displaced bicondylar fracture of right tibia, subsequent encounter for closed fracture with routine healing: Secondary | ICD-10-CM | POA: Diagnosis not present

## 2016-08-14 DIAGNOSIS — S060X1S Concussion with loss of consciousness of 30 minutes or less, sequela: Secondary | ICD-10-CM

## 2016-08-14 DIAGNOSIS — M792 Neuralgia and neuritis, unspecified: Secondary | ICD-10-CM | POA: Diagnosis present

## 2016-08-14 DIAGNOSIS — S82141A Displaced bicondylar fracture of right tibia, initial encounter for closed fracture: Secondary | ICD-10-CM | POA: Diagnosis present

## 2016-08-14 DIAGNOSIS — E8809 Other disorders of plasma-protein metabolism, not elsewhere classified: Secondary | ICD-10-CM | POA: Diagnosis present

## 2016-08-14 DIAGNOSIS — S22009S Unspecified fracture of unspecified thoracic vertebra, sequela: Secondary | ICD-10-CM

## 2016-08-14 DIAGNOSIS — K5903 Drug induced constipation: Secondary | ICD-10-CM | POA: Diagnosis present

## 2016-08-14 DIAGNOSIS — S02113A Unspecified occipital condyle fracture, initial encounter for closed fracture: Secondary | ICD-10-CM | POA: Diagnosis present

## 2016-08-14 LAB — CBC
HCT: 26.2 % — ABNORMAL LOW (ref 39.0–52.0)
HEMOGLOBIN: 8.9 g/dL — AB (ref 13.0–17.0)
MCH: 29.2 pg (ref 26.0–34.0)
MCHC: 34 g/dL (ref 30.0–36.0)
MCV: 85.9 fL (ref 78.0–100.0)
Platelets: 346 10*3/uL (ref 150–400)
RBC: 3.05 MIL/uL — AB (ref 4.22–5.81)
RDW: 13.5 % (ref 11.5–15.5)
WBC: 13 10*3/uL — ABNORMAL HIGH (ref 4.0–10.5)

## 2016-08-14 LAB — CREATININE, SERUM
CREATININE: 0.61 mg/dL (ref 0.61–1.24)
GFR calc Af Amer: >60 mL/min (ref >60)

## 2016-08-14 MED ORDER — OXYCODONE HCL 5 MG PO TABS
10.0000 mg | ORAL_TABLET | ORAL | Status: DC | PRN
  Administered 2016-08-15 – 2016-08-17 (×8): 20 mg via ORAL
  Administered 2016-08-19: 10 mg via ORAL
  Filled 2016-08-14 (×6): qty 4
  Filled 2016-08-14: qty 2
  Filled 2016-08-14 (×2): qty 4

## 2016-08-14 MED ORDER — ACETAMINOPHEN 325 MG PO TABS: 650.0000 mg | ORAL_TABLET | Freq: Four times a day (QID) | ORAL | Status: DC | PRN

## 2016-08-14 MED ORDER — ACETAMINOPHEN 650 MG RE SUPP: 650.0000 mg | Freq: Four times a day (QID) | RECTAL | Status: DC | PRN

## 2016-08-14 MED ORDER — BISACODYL 10 MG RE SUPP
10.0000 mg | Freq: Every day | RECTAL | Status: DC | PRN
  Administered 2016-08-15: 10 mg via RECTAL
  Filled 2016-08-14: qty 1

## 2016-08-14 MED ORDER — ONDANSETRON HCL 4 MG/2ML IJ SOLN: 4.0000 mg | Freq: Four times a day (QID) | INTRAMUSCULAR | Status: DC | PRN

## 2016-08-14 MED ORDER — ENOXAPARIN SODIUM 40 MG/0.4ML ~~LOC~~ SOLN
40.0000 mg | SUBCUTANEOUS | Status: DC
  Administered 2016-08-15 – 2016-08-20 (×6): 40 mg via SUBCUTANEOUS
  Filled 2016-08-14 (×9): qty 0.4

## 2016-08-14 MED ORDER — ENOXAPARIN SODIUM 40 MG/0.4ML ~~LOC~~ SOLN: 40.0000 mg | SUBCUTANEOUS | Status: DC

## 2016-08-14 MED ORDER — POLYETHYLENE GLYCOL 3350 17 G PO PACK
17.0000 g | PACK | Freq: Two times a day (BID) | ORAL | Status: DC
  Administered 2016-08-14: 17 g via ORAL

## 2016-08-14 MED ORDER — TRAMADOL HCL 50 MG PO TABS
100.0000 mg | ORAL_TABLET | Freq: Four times a day (QID) | ORAL | Status: DC
  Administered 2016-08-14 – 2016-08-21 (×20): 100 mg via ORAL
  Filled 2016-08-14 (×26): qty 2

## 2016-08-14 MED ORDER — BACITRACIN ZINC 500 UNIT/GM EX OINT
TOPICAL_OINTMENT | Freq: Two times a day (BID) | CUTANEOUS | Status: DC
  Administered 2016-08-14 – 2016-08-20 (×10): via TOPICAL
  Filled 2016-08-14 (×5): qty 28.35

## 2016-08-14 MED ORDER — BETHANECHOL CHLORIDE 25 MG PO TABS
25.0000 mg | ORAL_TABLET | Freq: Four times a day (QID) | ORAL | Status: DC
  Administered 2016-08-14 – 2016-08-17 (×12): 25 mg via ORAL
  Filled 2016-08-14 (×13): qty 1

## 2016-08-14 MED ORDER — SORBITOL 70 % SOLN
30.0000 mL | Freq: Every day | Status: DC | PRN
  Administered 2016-08-14: 30 mL via ORAL
  Filled 2016-08-14: qty 30

## 2016-08-14 MED ORDER — DOCUSATE SODIUM 100 MG PO CAPS
200.0000 mg | ORAL_CAPSULE | Freq: Two times a day (BID) | ORAL | Status: DC
  Administered 2016-08-14 – 2016-08-22 (×16): 200 mg via ORAL
  Filled 2016-08-14 (×16): qty 2

## 2016-08-14 MED ORDER — POLYETHYLENE GLYCOL 3350 17 G PO PACK
17.0000 g | PACK | Freq: Two times a day (BID) | ORAL | Status: DC
  Administered 2016-08-14 – 2016-08-20 (×11): 17 g via ORAL
  Filled 2016-08-14 (×16): qty 1

## 2016-08-14 MED ORDER — ONDANSETRON HCL 4 MG PO TABS: 4.0000 mg | ORAL_TABLET | Freq: Four times a day (QID) | ORAL | Status: DC | PRN

## 2016-08-14 MED ORDER — DOCUSATE SODIUM 100 MG PO CAPS
200.0000 mg | ORAL_CAPSULE | Freq: Two times a day (BID) | ORAL | Status: DC
  Administered 2016-08-14: 200 mg via ORAL
  Filled 2016-08-14: qty 2

## 2016-08-14 MED ORDER — METHOCARBAMOL 500 MG PO TABS: 500.0000 mg | ORAL_TABLET | Freq: Four times a day (QID) | ORAL | Status: DC | PRN
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
## 2016-08-14 NOTE — Care Management Note (Signed)
Case Management Note  Patient Details  Name: Marianna FussRicky A Vi MRN: [id number] Date of Birth: 05/31/1991  Subjective/Objective: Pt medically stable for dc today.                   Action/Plan: Plan dc to [facility] later today.    Expected Discharge Date:   08/14/2016               Expected Discharge Plan:  IP Rehab Facility  In-House Referral:     Discharge planning Services  CM Consult  Post Acute Care Choice:    Choice offered to:     DME Arranged:    DME Agency:     HH Arranged:    HH Agency:     Status of Service:  Completed, signed off  If discussed at [employer] Meetings, dates discussed:    Additional Comments:  Quintella BatonJulie W. Parrish Bonn, RN, BSN  Trauma/Neuro ICU Case Manager [phone]
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
## 2016-08-14 NOTE — Progress Notes (Signed)
Rehab admissions - I met with patient at the bedside.  I explained inpatient rehab.  He is agreeable to admission to inpatient rehab today.  Bed available and will admit to acute inpatient rehab today.  Call me for questions.  #[phone]
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
## 2016-08-15 ENCOUNTER — Encounter (HOSPITAL_COMMUNITY): Payer: Self-pay

## 2016-08-15 ENCOUNTER — Inpatient Hospital Stay (HOSPITAL_COMMUNITY): Payer: Self-pay | Admitting: Physical Therapy

## 2016-08-15 ENCOUNTER — Inpatient Hospital Stay (HOSPITAL_COMMUNITY): Payer: Self-pay | Admitting: Occupational Therapy

## 2016-08-15 DIAGNOSIS — D62 Acute posthemorrhagic anemia: Secondary | ICD-10-CM

## 2016-08-15 DIAGNOSIS — E871 Hypo-osmolality and hyponatremia: Secondary | ICD-10-CM

## 2016-08-15 DIAGNOSIS — M792 Neuralgia and neuritis, unspecified: Secondary | ICD-10-CM

## 2016-08-15 DIAGNOSIS — S129XXS Fracture of neck, unspecified, sequela: Secondary | ICD-10-CM

## 2016-08-15 DIAGNOSIS — S32591D Other specified fracture of right pubis, subsequent encounter for fracture with routine healing: Secondary | ICD-10-CM

## 2016-08-15 LAB — CBC WITH DIFFERENTIAL/PLATELET
BASOS ABS: 0 10*3/uL (ref 0.0–0.1)
BASOS PCT: 0 %
Eosinophils Absolute: 0.4 10*3/uL (ref 0.0–0.7)
Eosinophils Relative: 3 %
HEMATOCRIT: 26.8 % — AB (ref 39.0–52.0)
HEMOGLOBIN: 9 g/dL — AB (ref 13.0–17.0)
Lymphocytes Relative: 16 %
Lymphs Abs: 1.9 10*3/uL (ref 0.7–4.0)
MCH: 29 pg (ref 26.0–34.0)
MCHC: 33.6 g/dL (ref 30.0–36.0)
MCV: 86.5 fL (ref 78.0–100.0)
MONOS PCT: 7 %
Monocytes Absolute: 0.8 10*3/uL (ref 0.1–1.0)
NEUTROS ABS: 8.9 10*3/uL — AB (ref 1.7–7.7)
NEUTROS PCT: 74 %
Platelets: 376 10*3/uL (ref 150–400)
RBC: 3.1 MIL/uL — AB (ref 4.22–5.81)
RDW: 13.7 % (ref 11.5–15.5)
WBC: 12 10*3/uL — AB (ref 4.0–10.5)

## 2016-08-15 LAB — COMPREHENSIVE METABOLIC PANEL
ALBUMIN: 3.1 g/dL — AB (ref 3.5–5.0)
ALK PHOS: 68 U/L (ref 38–126)
ALT: 102 U/L — AB (ref 17–63)
AST: 95 U/L — AB (ref 15–41)
Anion gap: 7 (ref 5–15)
BUN: 12 mg/dL (ref 6–20)
CO2: 29 mmol/L (ref 22–32)
CREATININE: 0.58 mg/dL — AB (ref 0.61–1.24)
Calcium: 9.1 mg/dL (ref 8.9–10.3)
Chloride: 97 mmol/L — ABNORMAL LOW (ref 101–111)
GFR calc Af Amer: >60 mL/min (ref >60)
GFR calc non Af Amer: >60 mL/min (ref >60)
GLUCOSE: 116 mg/dL — AB (ref 65–99)
Potassium: 4.2 mmol/L (ref 3.5–5.1)
SODIUM: 133 mmol/L — AB (ref 135–145)
TOTAL PROTEIN: 6.2 g/dL — AB (ref 6.5–8.1)
Total Bilirubin: 0.9 mg/dL (ref 0.3–1.2)

## 2016-08-15 MED ORDER — INFLUENZA VAC SPLIT QUAD 0.5 ML IM SUSY
0.5000 mL | PREFILLED_SYRINGE | INTRAMUSCULAR | Status: DC
Start: 2016-08-16 — End: 2016-08-22
  Filled 2016-08-15 (×2): qty 0.5
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
## 2016-08-15 NOTE — Care Management Note (Signed)
Inpatient Rehabilitation Center Individual Statement of Services  Patient Name:  Steven [name]  Date:  08/15/2016  Welcome to the Inpatient Rehabilitation Center.  Our goal is to provide you with an individualized program based on your diagnosis and situation, designed to meet your specific needs.  With this comprehensive rehabilitation program, you will be expected to participate in at least 3 hours of rehabilitation therapies Monday-Friday, with modified therapy programming on the weekends.  Your rehabilitation program will include the following services:  Physical Therapy (PT), Occupational Therapy (OT), Speech Therapy (ST), 24 hour per day rehabilitation nursing, Therapeutic Recreaction (TR), Case Management (Social Worker), Rehabilitation Medicine, Nutrition Services and Pharmacy Services  Weekly team conferences will be held on Tuesday to discuss your progress.  Your Social Worker will talk with you frequently to get your input and to update you on team discussions.  Team conferences with you and your family in attendance may also be held.  Expected length of stay: 12-14 days Overall anticipated outcome: supervison set-up-ambulation and mod/i-ADL's  Depending on your progress and recovery, your program may change. Your Social Worker will coordinate services and will keep you informed of any changes. Your Social Worker's name and contact numbers are listed  below.  The following services may also be recommended but are not provided by the Inpatient Rehabilitation Center:    Home Health Rehabiltiation Services  Outpatient Rehabilitation Services  Vocational Rehabilitation   Arrangements will be made to provide these services after discharge if needed.  Arrangements include referral to agencies that provide these services.  Your insurance has been verified to be:  Self pay-third party liability Your primary doctor is:  None  Pertinent information will be shared with your doctor and  your insurance company.  Social Worker:  Amada JupiterLucy Hoyle, Tennessee[place] [phone] or ([phone]) [phone]   Information discussed with and copy given to patient by: Lucy Chrisupree, Fritzi Scripter G, 08/15/2016, 10:44 AM
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
## 2016-08-15 NOTE — Progress Notes (Signed)
Patient information reviewed and entered into eRehab system by Ranald Alessio, RN, CRRN, PPS Coordinator.  Information including medical coding and functional independence measure will be reviewed and updated through discharge.    

## 2016-08-15 NOTE — Progress Notes (Signed)
Physical Therapy Session Note  Patient Details  Name: Steven [name] MRN: [id number] Date of Birth: 07/27/1991  Today's Date: 08/15/2016 PT Individual Time:1617-1700   PT Individual Time Calculation: 43min    Short Term Goals: Week 1:  PT Short Term Goal 1 (Week 1): pt to perform functional transfers with min PT Short Term Goal 2 (Week 1): pt to gait 25' with min A in controlled environment PT Short Term Goal 3 (Week 1): pt supervision with w/c mobility 50' controlled environment  Skilled Therapeutic Interventions/Progress Updates:     Patient received asleep in bed and aroused easily and agreeable to PT. PT educated patient on plan of care, and treatment plan while in IP rehab. PT also engaged patient in d/c planning discussion due to NWB status on the RLE and need to ascend 3 steps. PT provided patient with pamphlet for [employer] ramp rentals discussed need to initiate conversation with family to allow safe access into home.   PT instructed patient in supine therex BLE with AROM LLE and AAROM on the RLE with cues to prevent WB through RLE; hip abduction, heel slides, SLR, Ankle pumps all exercises completed 2x8 BLE  Patient left supine in bed with call bell in reach.   Therapy Documentation Precautions:  Precautions Precautions: Fall, Cervical Type of Shoulder Precautions: WBAT, NO Rt shoulder Abduction Required Braces or Orthoses: Cervical Brace, Other Brace/Splint Cervical Brace: Hard collar, At all times Other Brace/Splint: knee brace on at all times Restrictions Weight Bearing Restrictions: Yes RUE Weight Bearing: Weight bearing as tolerated RLE Weight Bearing: Non weight bearing General:   Vital Signs: Therapy Vitals Temp: 99.1 F (37.3 C) Temp Source: Oral Pulse Rate: 95 Resp: 18 BP: (!) 156/79 Patient Position (if appropriate): Sitting Oxygen Therapy SpO2: 100 % O2 Device: Not Delivered   See Function Navigator for Current Functional Status.   Therapy/Group:  Individual Therapy  Golden Popustin E Amzie Sillas 08/15/2016, 4:44 PM

## 2016-08-16 ENCOUNTER — Inpatient Hospital Stay (HOSPITAL_COMMUNITY): Payer: No Typology Code available for payment source | Admitting: Physical Therapy

## 2016-08-16 ENCOUNTER — Inpatient Hospital Stay (HOSPITAL_COMMUNITY): Payer: No Typology Code available for payment source

## 2016-08-16 ENCOUNTER — Inpatient Hospital Stay (HOSPITAL_COMMUNITY): Payer: Self-pay

## 2016-08-16 DIAGNOSIS — S02113S Unspecified occipital condyle fracture, sequela: Secondary | ICD-10-CM

## 2016-08-16 DIAGNOSIS — M7989 Other specified soft tissue disorders: Secondary | ICD-10-CM

## 2016-08-16 DIAGNOSIS — S4411XS Injury of median nerve at upper arm level, right arm, sequela: Secondary | ICD-10-CM

## 2016-08-16 DIAGNOSIS — S82141S Displaced bicondylar fracture of right tibia, sequela: Secondary | ICD-10-CM

## 2016-08-16 DIAGNOSIS — S4411XA Injury of median nerve at upper arm level, right arm, initial encounter: Secondary | ICD-10-CM

## 2016-08-16 MED ORDER — GABAPENTIN 100 MG PO CAPS
100.0000 mg | ORAL_CAPSULE | Freq: Three times a day (TID) | ORAL | Status: DC
  Administered 2016-08-16 – 2016-08-18 (×9): 100 mg via ORAL
  Filled 2016-08-16 (×9): qty 1

## 2016-08-16 NOTE — Progress Notes (Signed)
Occupational Therapy Note  Patient Details  Name: Steven [name] MRN: [id number] Date of Birth: 02/04/1991  Today's Date: 08/16/2016 OT Individual Time: 1400-1430 OT Individual Time Calculation (min): 30 min    Pt c/o RLE pain 8/10; RN aware and repositioned Individual Therapy  Pt asleep upon arrival but easily aroused.  Focus on bed mobility, w/c mobility, functional amb with PFRW, discharge planning, and safety awareness to increase independence with BADLs.  Pt requires occasional min A with w/c mobiility.  Pt performs sit<>stand and functional amb with PFRW at close supervision level.  Pt remained in w/c with all needs within reach.    Lavone NeriLanier, Franchot Pollitt [facility] 08/16/2016, 2:51 PM
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
## 2016-08-16 NOTE — Progress Notes (Signed)
**  Preliminary report by tech**  Bilateral lower extremity venous duplex completed. There is no evidence of deep or superficial vein thrombosis involving the right and left lower extremities. All visualized vessels appear patent and compressible. There is no evidence of Baker's cysts bilaterally.  08/16/16 4:45 PM Olen CordialGreg Deshawn Witty RVT

## 2016-08-16 NOTE — Progress Notes (Signed)
Physical Therapy Session Note  Patient Details  Name: Steven FussRicky A Fearn MRN: [id number] Date of Birth: 11/25/1990  Today's Date: 08/16/2016 PT Individual Time: 1450-1530 PT Individual Time Calculation (min): 40 min   Short Term Goals: Week 1:  PT Short Term Goal 1 (Week 1): pt to perform functional transfers with min PT Short Term Goal 2 (Week 1): pt to gait 25' with min A in controlled environment PT Short Term Goal 3 (Week 1): pt supervision with w/c mobility 50' controlled environment  Skilled Therapeutic Interventions/Progress Updates:    Patient in bed upon arrival, agreeable to therapy. Performed supine > sit using rail and stand pivot transfers with close supervision and cues to lock brakes. Patient's wheelchair noted to be too large and promoting RLE internal rotation, therefore switched 18 x 16 wheelchair for 16 x 16 wheelchair with cushion and level elevating leg rest with improved sitting tolerance and neutral positioning noted. Patient propelled wheelchair throughout rehab unit 2 x 150 ft with supervision and increased time via L hemi technique. Bed mobility on mat table with min A for RLE. Supine RLE assisted heel slides x 10 and PROM LLE heel cord and hamstring stretch with patient only able to tolerate approx 30 deg hip flexion due to pain/tightness. Patient appeared to have increased pain with mobility but reports pain steady at 6-7/10 throughout session and patient grimacing due to increased effort with mobility. Patient returned to room and left sitting in wheelchair with all needs within reach.   Therapy Documentation Precautions:  Precautions Precautions: Fall, Cervical Type of Shoulder Precautions: WBAT, NO Rt shoulder Abduction Required Braces or Orthoses: Cervical Brace, Other Brace/Splint Cervical Brace: Hard collar, At all times Other Brace/Splint: knee brace on at all times Restrictions Weight Bearing Restrictions: Yes RUE Weight Bearing: Weight bearing as  tolerated RLE Weight Bearing: Non weight bearing Pain: Pain Assessment Pain Assessment: 0-10 Pain Score: 7  Pain Type: Acute pain Pain Location: Leg Pain Orientation: Right Pain Descriptors / Indicators: Aching Pain Onset: On-going Pain Intervention(s): Medication (See eMAR)   See Function Navigator for Current Functional Status.   Therapy/Group: Individual Therapy  [name], Surina Storts A 08/16/2016, 4:30 PM

## 2016-08-16 NOTE — Progress Notes (Signed)
Physical Therapy Session Note  Patient Details  Name: Steven [name] MRN: [id number] Date of Birth: Mar 23, 1991  Today's Date: 08/16/2016 PT Individual Time: 1007-1105 PT Individual Time Calculation (min): 58 min    Short Term Goals: Week 1:  PT Short Term Goal 1 (Week 1): pt to perform functional transfers with min PT Short Term Goal 2 (Week 1): pt to gait 25' with min A in controlled environment PT Short Term Goal 3 (Week 1): pt supervision with w/c mobility 50' controlled environment  Skilled Therapeutic Interventions/Progress Updates:   Pt presented in bed agreeable to therapy. Readjusted brace to correct placement. Supine to sit CGA with add'l time required 2/2 guarding/pain. C/o pain 7/10 nsg notified and provided pain meds during session.Stans pivot transfer to w/c minA with compliance to wt bearing restrictions. Pt with increase c/o HC pain with RLE in elevated position. Adjusted leg rest with improved result. W/c mobilty 55f with minA and mod cues for increased propulsion with LLE vs LUE with fair carryover. Ambulated 1047fwith platform walker and able to maintain NWB status entire distance. Pt returned to bed with all needs met.   Therapy Documentation Precautions:  Precautions Precautions: Fall, Cervical Type of Shoulder Precautions: WBAT, NO Rt shoulder Abduction Required Braces or Orthoses: Cervical Brace, Other Brace/Splint Cervical Brace: Hard collar, At all times Other Brace/Splint: knee brace on at all times Restrictions Weight Bearing Restrictions: Yes RUE Weight Bearing: Weight bearing as tolerated RLE Weight Bearing: Non weight bearing General:   Vital Signs:  Pain: Pain Assessment Pain Assessment: 0-10 Pain Score: 7  Mobility:   Locomotion :    Trunk/Postural Assessment :    Balance:   Exercises:   Other Treatments:     See Function Navigator for Current Functional Status.   Therapy/Group: Individual Therapy  Graycee Greeson  Randolf Sansoucie, PTA  08/16/2016, 12:50 PM

## 2016-08-16 NOTE — Progress Notes (Signed)
Occupational Therapy Session Note  Patient Details  Name: Steven [name] MRN: [id number] Date of Birth: 07/05/1991  Today's Date: 08/16/2016 OT Individual Time: 0800-0900 OT Individual Time Calculation (min): 60 min     Short Term Goals: Week 1:  OT Short Term Goal 1 (Week 1): Pt will complete 2 grooming tasks in standing with supervision OT Short Term Goal 2 (Week 1): Pt will don pants with set-up/supervision using AE PRN OT Short Term Goal 3 (Week 1): Pt will complete simulated tub/shower combination transfer utilizing tub transfer bench in prep for d/c home.  OT Short Term Goal 4 (Week 1): Pt will wash UB with set-up assist  Skilled Therapeutic Interventions/Progress Updates:    Pt asleep upon arrival but easily aroused.  Pt engaged in BADL retraining at EOB, w/c mobility, bed mobility, discharge planning, and safety awareness to increase independence with BADLs.  Pt performs supine>sit EOB and sit<>stand at close supervision.  Pt requires assistance donning pants over RLE brace.  Pt requires assistance with fastening shoes and donning socks.  Pt requires occasional min A for w/c mobility.  Discussed home set up and made appropriate home safety recommendations.   Therapy Documentation Precautions:  Precautions Precautions: Fall, Cervical Type of Shoulder Precautions: WBAT, NO Rt shoulder Abduction Required Braces or Orthoses: Cervical Brace, Other Brace/Splint Cervical Brace: Hard collar, At all times Other Brace/Splint: knee brace on at all times Restrictions Weight Bearing Restrictions: Yes RUE Weight Bearing: Weight bearing as tolerated RLE Weight Bearing: Non weight bearing General:   Vital Signs: Therapy Vitals Temp: 98.9 F (37.2 C) Temp Source: Oral Pulse Rate: 82 Resp: 18 BP: (!) 143/65 Patient Position (if appropriate): Lying Oxygen Therapy SpO2: 100 % Pain: Pain Assessment Pain Assessment: 0-10 Pain Score: 7  Pain Type: Acute pain Pain Location: Arm Pain  Orientation: Right Pain Descriptors / Indicators: Aching Pain Onset: On-going Pain Intervention(s): Medication (See eMAR) ADL:   Exercises:   Other Treatments:    See Function Navigator for Current Functional Status.   Therapy/Group: Individual Therapy  Rich BraveLanier, Dawan Farney Chappell 08/16/2016, 2:54 PM
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
## 2016-08-17 ENCOUNTER — Inpatient Hospital Stay (HOSPITAL_COMMUNITY): Payer: No Typology Code available for payment source | Admitting: Occupational Therapy

## 2016-08-17 MED ORDER — CLONAZEPAM 0.5 MG PO TABS
0.5000 mg | ORAL_TABLET | Freq: Two times a day (BID) | ORAL | Status: DC
  Administered 2016-08-17 – 2016-08-22 (×10): 0.5 mg via ORAL
  Filled 2016-08-17 (×11): qty 1

## 2016-08-17 MED ORDER — OXYCODONE HCL ER 10 MG PO T12A
10.0000 mg | EXTENDED_RELEASE_TABLET | Freq: Two times a day (BID) | ORAL | Status: DC
  Administered 2016-08-17 (×2): 10 mg via ORAL
  Filled 2016-08-17 (×2): qty 1

## 2016-08-17 NOTE — Progress Notes (Signed)
Patient educated on need to call staff for transfer assistance. Patient alert/oriented x4. Patient noncompliant with safety measures.
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
## 2016-08-17 NOTE — Progress Notes (Signed)
Occupational Therapy Session Note  Patient Details  Name: Steven FussRicky A Glowacki MRN: [id number] Date of Birth: 11/15/1990  Today's Date: 08/17/2016 OT Individual Time:  - 1000-1100  (60 min)       Short Term Goals: Week 1:  OT Short Term Goal 1 (Week 1): Pt will complete 2 grooming tasks in standing with supervision OT Short Term Goal 2 (Week 1): Pt will don pants with set-up/supervision using AE PRN OT Short Term Goal 3 (Week 1): Pt will complete simulated tub/shower combination transfer utilizing tub transfer bench in prep for d/c home.  OT Short Term Goal 4 (Week 1): Pt will wash UB with set-up assist Week 2:     Skilled Therapeutic Interventions/Progress Updates:    Pt sitting in recliner.  Performed RUE AROM  to elbow with education to hold extension for 10 counts.  Pt returned instructions.  Did RLE PROm with instructions for pt to use LLEand sheet to assist with lifting.  Ppt ambulated with platform walker to sink and performed bathing and dressing with assistance with RLE bathing and socks.  Pt stated that he was planning to leave today even though it was not scheduled.  Left pt in recliner with all needs in reach.   Therapy Documentation Precautions:  Precautions Precautions: Fall, Cervical Type of Shoulder Precautions: WBAT, NO Rt shoulder Abduction Required Braces or Orthoses: Cervical Brace, Other Brace/Splint Cervical Brace: Hard collar, At all times Other Brace/Splint: knee brace on at all times Restrictions Weight Bearing Restrictions: Yes RUE Weight Bearing: Weight bearing as tolerated RLE Weight Bearing: Non weight bearing General:   Vital Signs:   Pain: Pain Assessment Pain Assessment: 0-10 Pain Score: 7  Pain Type: Acute pain Pain Location: Leg Pain Orientation: Right Pain Descriptors / Indicators: Aching Pain Onset: On-going Pain Intervention(s): Medication (See eMAR)    See Function Navigator for Current Functional Status.   Therapy/Group: Individual  Therapy  Humberto Sealsdwards, Adonys Wildes J 08/17/2016, 10:46 AM

## 2016-08-18 ENCOUNTER — Inpatient Hospital Stay (HOSPITAL_COMMUNITY): Payer: No Typology Code available for payment source | Admitting: Physical Therapy

## 2016-08-18 ENCOUNTER — Inpatient Hospital Stay (HOSPITAL_COMMUNITY): Payer: Self-pay | Admitting: Occupational Therapy

## 2016-08-18 MED ORDER — OXYCODONE HCL ER 15 MG PO T12A
15.0000 mg | EXTENDED_RELEASE_TABLET | Freq: Two times a day (BID) | ORAL | Status: DC
  Administered 2016-08-18 – 2016-08-21 (×7): 15 mg via ORAL
  Filled 2016-08-18 (×7): qty 1

## 2016-08-18 MED ORDER — BETHANECHOL CHLORIDE 25 MG PO TABS
25.0000 mg | ORAL_TABLET | Freq: Three times a day (TID) | ORAL | Status: DC
  Administered 2016-08-18 – 2016-08-19 (×6): 25 mg via ORAL
  Filled 2016-08-18 (×6): qty 1

## 2016-08-18 NOTE — Progress Notes (Signed)
Occupational Therapy Session Note  Patient Details  Name: Steven [name] MRN: [id number] Date of Birth: 04/23/1991  Today's Date: 08/18/2016 OT Individual Time: [id number] and [id number] OT Individual Time Calculation (min): 56 min and 33 minutes  Short Term Goals: Week 1:  OT Short Term Goal 1 (Week 1): Pt will complete 2 grooming tasks in standing with supervision OT Short Term Goal 2 (Week 1): Pt will don pants with set-up/supervision using AE PRN OT Short Term Goal 3 (Week 1): Pt will complete simulated tub/shower combination transfer utilizing tub transfer bench in prep for d/c home.  OT Short Term Goal 4 (Week 1): Pt will wash UB with set-up assist  Skilled Therapeutic Interventions/Progress Updates:   Pt was lying supine in bed at time of arrival, was agreeable to tx. Stand pivot transfer completed from bed to w/c with PF walker with min guard and cues for precautions. R LE observed to be on the ground during transfers. ADLs completed w/c level at sink with use of LH sponge. When therapist retrieved LH sponge from gym, pt had removed aspen collar for washing neck at sink. Per pt report "I've been doing this the whole time. They say it's okay as long as I keep my neck really still." Pt educated on importance of wearing collar at all times per MD orders and placed brace back on with verbalized understanding. Nursing made aware. Min Steven overall for standing balance, left TED hose, and right sock. Cuing required for adhering to precautions today, especially when using R UE. Pt perseverating during session regarding d/c being tomorrow. Pt educated on LOS per therapy notes. At end of session pt was transferred back to recliner and left with all needs within reach.   2nd Session 1:1 Tx (33 minutes) Pt was sitting in recliner at time of arrival and agreeable to tx. Simulated LB dressing completed with use of reacher and theraband with pt exhibiting good carryover with practice and extra time. Simulated  meal prep completed in therapy kitchen w/c level with instruction on home modifications. Fair carryover of technique, Educated on using L UE predominantly to adhere to R UE precautions. Pt verbalized understanding.  At end of session pt was returned to recliner and left with all needs within reach.    Therapy Documentation Precautions:  Precautions Precautions: Fall, Cervical Type of Shoulder Precautions: WBAT, NO Rt shoulder Abduction Required Braces or Orthoses: Cervical Brace, Other Brace/Splint Cervical Brace: Hard collar, At all times Other Brace/Splint: knee brace on at all times Restrictions Weight Bearing Restrictions: Yes RUE Weight Bearing: Weight bearing as tolerated RLE Weight Bearing: Non weight bearing  Pain: Pt reported pain to be manageable during session         See Function Navigator for Current Functional Status.   Therapy/Group: Individual Therapy  Steven [name] Steven [name] 08/18/2016, 12:32 PM

## 2016-08-18 NOTE — Progress Notes (Signed)
Physical Therapy Session Note  Patient Details  Name: Steven [name] MRN: [id number] Date of Birth: 07/19/1991  Today's Date: 08/18/2016 PT Individual Time: 1100-1153 and 1430-1515 PT Individual Time Calculation (min): 53 min and 45 min   Short Term Goals: Week 1:  PT Short Term Goal 1 (Week 1): pt to perform functional transfers with min PT Short Term Goal 2 (Week 1): pt to gait 25' with min A in controlled environment PT Short Term Goal 3 (Week 1): pt supervision with w/c mobility 50' controlled environment  Skilled Therapeutic Interventions/Progress Updates:    Treatment 1: Patient in recliner upon arrival. Adjusted R elevating leg rest for wheelchair for improved sitting tolerance and adherence to NWB status. Performed transfers via stand pivot with supervision and verbal cues to maintain NWB status. Patient propelled wheelchair via L hemi technique to gym with increased time and mod I. Patient requires verbal cues to lock both brakes and remove R leg rest prior to transfers. In supine, adjusted RLE brace to proper position and tightened straps and adjusted cervical collar. Instructed in supine therex for strengthening and ROM: R ankle pumps to fatigue , R quad sets with 3 sec hold and glute sets with 3 sec hold x 20 each, assisted RLE SLR x 5, assisted RLE hip abduction/adduction x 20, assisted RLE heel slides 2 x 10.  Gait training after adjusting PFRW for improved upright posture x 150 ft with supervision back to room. Patient left in recliner with BLE elevated and needs in reach.   Treatment 2: Patient in recliner upon arrival, performed stand pivot to wheelchair with max cues for maintaining NWB RLE. Patient propelled wheelchair to and from gym with mod I. Patient requires max cues to lock brakes and removed R ELR before attempting to stand/transfer. Stair training initiated up/down one 3" step using PFRW x 2 with min A, ascending backward and descending forward. Instructed in standing RLE  therex using PFRW x 10 each exercise: hip flexion, hip extension, and knee flexion within available ROM and seated RLE LAQ x 20 within available ROM, hip adduction ball squeezes x 20, seated LLE hip flexion x 20. Patient questioning why his pelvis hurts so much when he is sitting, educated on nondisplaced R superior and inferior pubic rami fractures and shown image, patient verbalized understanding. Patient performed transfers x 2 using PFRW with supervision and improved ability to maintain NWB RLE, therefore recommend patient continue to use PFRW for transfers at all times, patient verbalized understanding. Patient left in recliner with BLE elevated and needs in reach.   Therapy Documentation Precautions:  Precautions Precautions: Fall, Cervical Type of Shoulder Precautions: WBAT, NO Rt shoulder Abduction Required Braces or Orthoses: Cervical Brace, Other Brace/Splint Cervical Brace: Hard collar, At all times Other Brace/Splint: knee brace on at all times Restrictions Weight Bearing Restrictions: Yes RUE Weight Bearing: Weight bearing as tolerated RLE Weight Bearing: Non weight bearing Pain: Pain Assessment Pain Assessment: 0-10 Pain Score: 8  Pain Type: Acute pain Pain Location: Pelvis Pain Orientation: Left;Right Pain Descriptors / Indicators: Aching Pain Onset: On-going Pain Intervention(s): Medication (See eMAR)  See Function Navigator for Current Functional Status.   Therapy/Group: Individual Therapy  Steven [name], Steven [name] A 08/18/2016, 12:12 PM
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
## 2016-08-19 ENCOUNTER — Encounter (HOSPITAL_COMMUNITY): Payer: Self-pay

## 2016-08-19 ENCOUNTER — Inpatient Hospital Stay (HOSPITAL_COMMUNITY): Payer: No Typology Code available for payment source | Admitting: Physical Therapy

## 2016-08-19 ENCOUNTER — Inpatient Hospital Stay (HOSPITAL_COMMUNITY): Payer: No Typology Code available for payment source

## 2016-08-19 MED ORDER — GABAPENTIN 100 MG PO CAPS
200.0000 mg | ORAL_CAPSULE | Freq: Three times a day (TID) | ORAL | Status: DC
  Administered 2016-08-19 – 2016-08-22 (×10): 200 mg via ORAL
  Filled 2016-08-19 (×9): qty 2

## 2016-08-19 MED ORDER — ALPRAZOLAM 0.5 MG PO TABS: 0.5000 mg | ORAL_TABLET | Freq: Three times a day (TID) | ORAL | Status: DC | PRN

## 2016-08-19 NOTE — Progress Notes (Signed)
Occupational Therapy Note  Patient Details  Name: Marianna FussRicky A Eustice MRN: [id number] Date of Birth: 04/29/1991  Today's Date: 08/19/2016 OT Individual Time: 1300-1400 OT Individual Time Calculation (min): 60 min    Pt denied pain Individual therapy  Pt standing at doorway with PFRW and attempting to exit unit and meet friend at entrance to discharge from hospital.  Engaged in lengthy discussion regarding purpose of rehab and pt's deficits importance of continued therapy.  Discussed at length with patient regarding his request for discharge home.  PA spoke with grandmother who adamantly requested pt remain on rehab for now as she is physically unable to care for patient.  CSW notified with equipment needs and arrangements being made to complete family education and discharge within next 1-2 days. Pt remained in room sitting EOB on phone. Focus on continued education and safety awareness.   Lavone NeriLanier, Kaydon Husby [facility] 08/19/2016, 2:42 PM

## 2016-08-19 NOTE — Progress Notes (Signed)
Occupational Therapy Session Note  Patient Details  Name: Steven FussRicky A Mangel MRN: [id number] Date of Birth: 01/05/1991  Today's Date: 08/19/2016 OT Individual Time: [id number] OT Individual Time Calculation (min): 55 min     Short Term Goals: Week 1:  OT Short Term Goal 1 (Week 1): Pt will complete 2 grooming tasks in standing with supervision OT Short Term Goal 2 (Week 1): Pt will don pants with set-up/supervision using AE PRN OT Short Term Goal 3 (Week 1): Pt will complete simulated tub/shower combination transfer utilizing tub transfer bench in prep for d/c home.  OT Short Term Goal 4 (Week 1): Pt will wash UB with set-up assist  Skilled Therapeutic Interventions/Progress Updates:    Pt sitting EOB upon arrival eating breakfast.  Pt uses his RUE at diminished level to assist with opening containers and spreading butter and jelly on bagel.  Pt uses his LUE for self feeding tasks.  Pt stated during conversation that he was going home today.  Recommended that pt remain in hospital a few more days to address ability to complete BADLs without assistance.  Pt states he thinks he will do better at home and he doesn't like having so many people around him.  Discussed DME needs and recommendation for ongoing therapies to increase independence.  Pt states he doesn't have any clean clothing and declined bathing this morning.  PA and CSW notified of pt's desire to go home today.  Pt currently requires assistance with transfers and BADLs. Pt remained in bed with all needs within reach and bed alarm activated.  Foucs on activity tolerance, bed mobility, discharge planning, emotional support, and activity tolerance.  Therapy Documentation Precautions:  Precautions Precautions: Fall, Cervical Type of Shoulder Precautions: WBAT, NO Rt shoulder Abduction Required Braces or Orthoses: Cervical Brace, Other Brace/Splint Cervical Brace: Hard collar, At all times Other Brace/Splint: knee brace on at all  times Restrictions Weight Bearing Restrictions: Yes RUE Weight Bearing: Weight bearing as tolerated RLE Weight Bearing: Non weight bearing Pain: Pt c/o headache (unrated); RN aware and cold wash cloth provided for forehead See Function Navigator for Current Functional Status.   Therapy/Group: Individual Therapy  Rich BraveLanier, Kamyra Schroeck Chappell 08/19/2016, 8:56 AM

## 2016-08-19 NOTE — Progress Notes (Signed)
Patient requesting discharge home. Spoke at length with patient on need to complete rehabilitation program as well as family teaching and equipment needs for home. Spoke with his grandmother who is 23 years old and she stated that physically at this time she is not able to provide care for him. She requests that he continue with his rehabilitation program. Therapy team has been advised on plan of care and discuss tomorrow in team conference

## 2016-08-19 NOTE — Progress Notes (Signed)
Again discussed at length with patient in regards to request for discharge to home. Patient medically not ready for discharge with equipment orders underway family teaching needing to be completed. Again spoke with his grandmother she adamantly requested patient remain on the rehabilitation unit for now as she physically cannot take care of patient in his current condition. Arrangements being made to complete family teaching and hopeful for discharge to home in the next 1-2 days.

## 2016-08-19 NOTE — Progress Notes (Signed)
Physical Therapy Note  Patient Details  Name: Steven FussRicky A Lein MRN: [id number] Date of Birth: 07/25/1991 Today's Date: 08/19/2016   At 4:27 pm attempted to see pt to make up missed time from this afternoon. Pt with visitor present who encouraged him to participate in session but pt unwilling, instead requesting to "wake up with a new attitude and try again tomorrow". Pt left sitting on EOB with visitor present, bed alarm set & all needs within reach. Will follow up per plan of care.    Sandi MariscalVictoria M Nicko Daher 08/19/2016, 5:26 PM
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
## 2016-08-19 NOTE — Progress Notes (Signed)
Patient A/O, no noted distress and denies pain. Patient refused his 0600 schedule pain regimen, no pain. Band aid administered on third digit, last him. Patient slept well through out night. Staff will continue to monitor and meet needs.
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
## 2016-08-19 NOTE — Progress Notes (Signed)
Social Work Patient ID: Steven [name], male   DOB: 03/17/1991, 25 y.o.   MRN: [id number]  Team states pt wants to discharge tomorrow doesn't feel he needs to stay here. Have placed equipment order for pt and to be delivered to his room Today. Pt may discharge tomorrow, will need to make sure has equipment prior to discharge.

## 2016-08-19 NOTE — Progress Notes (Signed)
Physical Therapy Session Note  Patient Details  Name: Steven [name] MRN: [id number] Date of Birth: 07/12/1991  Today's Date: 08/19/2016 PT Individual Time: 1435-1520 PT Individual Time Calculation (min): 45 min    Short Term Goals: Week 1:  PT Short Term Goal 1 (Week 1): pt to perform functional transfers with min PT Short Term Goal 2 (Week 1): pt to gait 25' with min A in controlled environment PT Short Term Goal 3 (Week 1): pt supervision with w/c mobility 50' controlled environment  Skilled Therapeutic Interventions/Progress Updates:    Pt received in bed noting decreased willingness to participate in therapy on this date. Attempted to engage pt in conversation and provided therapeutic listening as pt very frustrated by fact he cannot d/c home today and that grandmother thinks he should stay in hospital. Pt reports he has improved functionally compared to day of admission. Discussed d/c plans with pt and importance of continuing to participate in therapy for pt/family education regarding braces (knee brace, c-collar) and for mobility tasks such as stair negotiation for home access. Pt reports he will get home and "just figure it out" with him noting someone can carry him up the stairs into his house. Educated pt that this is unsafe method and provided pt with handout detailing stair negotiation in w/c with 2 person assist and provided explanation of task. Encouraged pt to ask family/friends to come in for family training so they can be educated on stair negotiation. Therapist adjusted pt's R knee brace; pt unable to instruct therapist in adjusting brace therefore therapist educated pt on proper positioning. Pt encouraged to participate in tx session in gym but not agreeable at this time. Pt left in bed with alarm set & all needs within reach. Will follow up per plan of care.   Therapy Documentation Precautions:  Precautions Precautions: Fall, Cervical Type of Shoulder Precautions: WBAT, NO Rt  shoulder Abduction Required Braces or Orthoses: Cervical Brace, Other Brace/Splint Cervical Brace: Hard collar, At all times Other Brace/Splint: knee brace on at all times Restrictions Weight Bearing Restrictions: Yes RUE Weight Bearing: Weight bearing as tolerated RLE Weight Bearing: Non weight bearing   General: PT Amount of Missed Time (min): 30 Minutes PT Missed Treatment Reason: Patient unwilling to participate   See Function Navigator for Current Functional Status.   Therapy/Group: Individual Therapy  Sandi MariscalVictoria M Miller 08/19/2016, 5:19 PM

## 2016-08-20 ENCOUNTER — Inpatient Hospital Stay (HOSPITAL_COMMUNITY): Payer: No Typology Code available for payment source | Admitting: Physical Therapy

## 2016-08-20 ENCOUNTER — Inpatient Hospital Stay (HOSPITAL_COMMUNITY): Payer: No Typology Code available for payment source

## 2016-08-20 ENCOUNTER — Inpatient Hospital Stay (HOSPITAL_COMMUNITY): Payer: Self-pay

## 2016-08-20 LAB — BASIC METABOLIC PANEL
Anion gap: 8 (ref 5–15)
BUN: 8 mg/dL (ref 6–20)
CHLORIDE: 100 mmol/L — AB (ref 101–111)
CO2: 26 mmol/L (ref 22–32)
CREATININE: 0.64 mg/dL (ref 0.61–1.24)
Calcium: 9.1 mg/dL (ref 8.9–10.3)
GFR calc non Af Amer: >60 mL/min (ref >60)
Glucose, Bld: 104 mg/dL — ABNORMAL HIGH (ref 65–99)
POTASSIUM: 3.8 mmol/L (ref 3.5–5.1)
SODIUM: 134 mmol/L — AB (ref 135–145)

## 2016-08-20 LAB — CBC
HEMATOCRIT: 28.2 % — AB (ref 39.0–52.0)
HEMOGLOBIN: 9.2 g/dL — AB (ref 13.0–17.0)
MCH: 28.8 pg (ref 26.0–34.0)
MCHC: 32.6 g/dL (ref 30.0–36.0)
MCV: 88.4 fL (ref 78.0–100.0)
Platelets: 423 10*3/uL — ABNORMAL HIGH (ref 150–400)
RBC: 3.19 MIL/uL — AB (ref 4.22–5.81)
RDW: 14 % (ref 11.5–15.5)
WBC: 9.1 10*3/uL (ref 4.0–10.5)

## 2016-08-20 MED ORDER — BETHANECHOL CHLORIDE 10 MG PO TABS
10.0000 mg | ORAL_TABLET | Freq: Three times a day (TID) | ORAL | Status: DC
  Administered 2016-08-20 – 2016-08-21 (×3): 10 mg via ORAL
  Filled 2016-08-20 (×3): qty 1
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
## 2016-08-20 NOTE — Progress Notes (Signed)
Physical Therapy Session Note  Patient Details  Name: Steven [name] MRN: [id number] Date of Birth: 10/21/1990  Today's Date: 08/20/2016 PT Individual Time: 1001-1104 PT Individual Time Calculation (min): 63 min    Short Term Goals: Week 1:  PT Short Term Goal 1 (Week 1): pt to perform functional transfers with min PT Short Term Goal 2 (Week 1): pt to gait 25' with min A in controlled environment PT Short Term Goal 3 (Week 1): pt supervision with w/c mobility 50' controlled environment  Skilled Therapeutic Interventions/Progress Updates:    Pt received in bed & agreeable to tx, denying c/o pain at rest. Pt able to recall cervical, RLE NWB, and RUE precautions with minimal cuing from therapist. Pt able to instruct therapist in repositioning of RLE brace with 50% cuing from therapist; educated pt on importance of being able to direct family/friends when assisting him with braces. Discussed d/c plans and home entry as pt has 3 steps with R rail to enter home with pt stating he'll "figure it out". Educated pt on risks of attempting to negotiate stairs with 1 rail and increased safety of bumping up steps in w/c with pt voicing understanding. Pt completed car transfer from van simulated seat height with PFRW & supervision; therapist provided cuing for NWB RLE during sit>stand portion of transfer, as well as to square up to seat before transferring to sitting. Educated pt on need to use PFRW for stand pivot transfers versus no AD as pt more tempted to bear weight through RLE & has controlled fall into w/c when not using device. Therapist performed LLE hamstring stretch with pt only tolerating to ~30 degrees with visible trembling noted in extremity but pt reporting this is baseline. Provided pt with BLE HEP but unable to complete exercises due to time constraints; provided instruction on sheet & verbally instructed pt he should wear RLE brace at all times, especially during exercises. During session pt  propelled w/c with L hemi technique with Mod I. At end of session pt left sitting in w/c in room with all needs within reach & instructions to call for assistance when wanting to transfer out of w/c & pt voicing understanding.   Therapy Documentation Precautions:  Precautions Precautions: Fall, Cervical Type of Shoulder Precautions: WBAT, NO Rt shoulder Abduction Required Braces or Orthoses: Cervical Brace, Other Brace/Splint Cervical Brace: Hard collar, At all times Other Brace/Splint: knee brace on at all times Restrictions Weight Bearing Restrictions: Yes RUE Weight Bearing: Weight bearing as tolerated RLE Weight Bearing: Non weight bearing   See Function Navigator for Current Functional Status.   Therapy/Group: Individual Therapy  Sandi MariscalVictoria M Dequante Tremaine 08/20/2016, 3:50 PM

## 2016-08-20 NOTE — Progress Notes (Signed)
Occupational Therapy Session Note  Patient Details  Name: Steven [name] MRN: [id number] Date of Birth: 04/06/1991  Today's Date: 08/20/2016 OT Individual Time: 0800-0900 OT Individual Time Calculation (min): 60 min     Short Term Goals: Week 1:  OT Short Term Goal 1 (Week 1): Pt will complete 2 grooming tasks in standing with supervision OT Short Term Goal 2 (Week 1): Pt will don pants with set-up/supervision using AE PRN OT Short Term Goal 3 (Week 1): Pt will complete simulated tub/shower combination transfer utilizing tub transfer bench in prep for d/c home.  OT Short Term Goal 4 (Week 1): Pt will wash UB with set-up assist  Skilled Therapeutic Interventions/Progress Updates:    Pt resting in bed upon arrival and agreeable to participating in therapy.  Pt engaged in BADL retraining including bathing and dressing with sit<>stand from EOB.  Pt able to don/doff socks after propping RLE onto bed. Pt amb with PFRW to day room and engaged in simple home mgmt tasks.  Pt issued walker bag and reacher bag.  Pt able to retrieve items from floor with use of reacher.  Continued discharge planning and home safety education. Pt continues to express desire to go home as soon as possible.  Therapy Documentation Precautions:  Precautions Precautions: Fall, Cervical Type of Shoulder Precautions: WBAT, NO Rt shoulder Abduction Required Braces or Orthoses: Cervical Brace, Other Brace/Splint Cervical Brace: Hard collar, At all times Other Brace/Splint: knee brace on at all times Restrictions Weight Bearing Restrictions: Yes RUE Weight Bearing: Weight bearing as tolerated RLE Weight Bearing: Non weight bearing General:   Vital Signs: Therapy Vitals Temp: 98.5 F (36.9 C) Temp Source: Oral Pulse Rate: 80 Resp: 18 BP: (!) 121/56 Patient Position (if appropriate): Lying Oxygen Therapy SpO2: 100 % O2 Device: Not Delivered Pain: Pain Assessment Pain Assessment: 0-10 Pain Score: 0-No  pain ADL:   Exercises:   Other Treatments:    See Function Navigator for Current Functional Status.   Therapy/Group: Individual Therapy  Rich BraveLanier, Peggy Loge Chappell 08/20/2016, 9:05 AM

## 2016-08-20 NOTE — Progress Notes (Signed)
Physical Therapy Session Note  Patient Details  Name: Steven [name] MRN: [id number] Date of Birth: 07/03/1991  Today's Date: 08/20/2016 PT Individual Time: [id number] PT Individual Time Calculation (min): 83 min   Short Term Goals: Week 1:  PT Short Term Goal 1 (Week 1): pt to perform functional transfers with min PT Short Term Goal 2 (Week 1): pt to gait 25' with min A in controlled environment PT Short Term Goal 3 (Week 1): pt supervision with w/c mobility 50' controlled environment  Skilled Therapeutic Interventions/Progress Updates:    Upon arrival to room, patient ambulating to bathroom unassisted using PFRW without socks/footwear donned and non-compliant with NWB RLE as he was TDWB RLE and cervical collar loose, nurse tech notified. Adjusted cervical collar and donned B socks and L shoe. Patient stating, "I'm fine, can I just have a minute?" and required max cues for WB precautions leaving bathroom and standing for hand hygiene.Patient asked if he could shower before leaving on Thursday, re-educated that he is not yet cleared to shower by MD and cannot remove R knee brace, needs reinforcement. Patient propelled wheelchair throughout rehab unit with mod I but required cues to lock brakes, remove leg rest, and use PFRW for stand pivot transfer to mat with supervision. Supine BLE therex for strengthening and ROM: ankle pumps to fatigue, glute sets and quad sets with 3 sec hold x 20 each, SLR with assist for LLE x 20 each LE, heel slides x 20 each LE, hip abduction/adduction x 20 each LE. Gait training using PFRW x 150 ft + 50 ft with supervision. Performed Dyn[employer]ision in mode A for standing balance/tolerance and functional use RUE x 2 trials with each hand and PFRW for single UE support: LUE = 53 hits + 72 hits and RUE = 40 hits + 47 hits. Patient sat EOB while therapist retrieved recliner for room and performed stand pivot to recliner using PFRW with supervision. Patient left in recliner with BLE  elevated and needs in reach.   Therapy Documentation Precautions:  Precautions Precautions: Fall, Cervical Type of Shoulder Precautions: WBAT, NO Rt shoulder Abduction Required Braces or Orthoses: Cervical Brace, Other Brace/Splint Cervical Brace: Hard collar, At all times Other Brace/Splint: knee brace on at all times Restrictions Weight Bearing Restrictions: Yes RUE Weight Bearing: Weight bearing as tolerated RLE Weight Bearing: Non weight bearing Pain: Pain Assessment Pain Assessment: 0-10 Pain Score: 3  Pain Type: Acute pain Pain Location: Pelvis Pain Descriptors / Indicators: Aching Pain Onset: On-going Pain Intervention(s): Repositioned   See Function Navigator for Current Functional Status.   Therapy/Group: Individual Therapy  [name], Chael Urenda A 08/20/2016, 4:20 PM

## 2016-08-21 ENCOUNTER — Inpatient Hospital Stay (HOSPITAL_COMMUNITY): Payer: No Typology Code available for payment source

## 2016-08-21 ENCOUNTER — Inpatient Hospital Stay (HOSPITAL_COMMUNITY): Payer: No Typology Code available for payment source | Admitting: Physical Therapy

## 2016-08-21 MED ORDER — MORPHINE SULFATE ER 15 MG PO TBCR
15.0000 mg | EXTENDED_RELEASE_TABLET | Freq: Two times a day (BID) | ORAL | Status: DC
  Administered 2016-08-21 – 2016-08-22 (×2): 15 mg via ORAL
  Filled 2016-08-21 (×3): qty 1

## 2016-08-21 MED ORDER — OXYCODONE HCL ER 10 MG PO T12A: 10.0000 mg | EXTENDED_RELEASE_TABLET | Freq: Two times a day (BID) | ORAL | Status: DC

## 2016-08-21 MED ORDER — BETHANECHOL CHLORIDE 10 MG PO TABS
10.0000 mg | ORAL_TABLET | Freq: Two times a day (BID) | ORAL | Status: DC
  Filled 2016-08-21 (×2): qty 1
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
## 2016-08-21 NOTE — Progress Notes (Signed)
Occupational Therapy Discharge Summary  Patient Details  Name: Steven [name] MRN: [id number] Date of Birth: August 12, 1991   Patient has met 8 of 8 long term goals due to improved activity tolerance, improved balance, postural control, ability to compensate for deficits, functional use of  RIGHT upper extremity and improved coordination. Pt made steady progress with BADLs during this admission.  Pt performs bathing and dressing tasks at supervision/mod I level and is mod I with toilet transfers and toileting tasks.  Family has not been present for family education with OT.  Pt is discharging home with his foster grandmother and aunt who are unable to provide physical assistance but are able to provide recommended supervision.  Patient to discharge at overall Supervision - mod I level.  Patient's care partner is independent to provide the necessary physical assistance at discharge.      Recommendation:  Patient will benefit from ongoing skilled OT services in home health setting to continue to advance functional skills in the area of BADL and iADL.  Equipment: BSC  Reasons for discharge: treatment goals met and discharge from hospital  Patient/family agrees with progress made and goals achieved: Yes  OT Discharge Precautions/Restrictions  Precautions Precautions: Fall Type of Shoulder Precautions: no RUE abduction Required Braces or Orthoses: Other Brace/Splint Cervical Brace: Hard collar;At all times Other Brace/Splint: hinged brace on RLE at all times Restrictions Weight Bearing Restrictions: Yes RUE Weight Bearing: Weight bearing as tolerated RLE Weight Bearing: Non weight bearing Vision/Perception  Vision- History Baseline Vision/History: No visual deficits Patient Visual Report: No change from baseline Vision- Assessment Vision Assessment?: No apparent visual deficits  Cognition Overall Cognitive Status: Within Functional Limits for tasks assessed Arousal/Alertness:  Awake/alert Orientation Level: Oriented X4 Attention: Sustained;Selective Sustained Attention: Appears intact Selective Attention: Appears intact Memory: Appears intact Awareness: Appears intact Problem Solving: Appears intact Safety/Judgment: Impaired Sensation Sensation Light Touch: Appears Intact Light Touch Impaired Details: Impaired RUE Proprioception: Impaired by gross assessment Proprioception Impaired Details: Impaired RUE Coordination Gross Motor Movements are Fluid and Coordinated: Yes Mobility  Bed Mobility Bed Mobility: Rolling Left;Rolling Right;Supine to Sit;Sit to Supine Rolling Right: 6: Modified independent (Device/Increase time) Rolling Left: 6: Modified independent (Device/Increase time) Supine to Sit: 5: Supervision Supine to Sit Details: Verbal cues for precautions/safety Sit to Supine: 6: Modified independent (Device/Increase time) Transfers Sit to Stand: 5: Supervision (with Right PFRW) Sit to Stand Details: Verbal cues for precautions/safety;Verbal cues for technique Stand to Sit: 5: Supervision Stand to Sit Details (indicate cue type and reason): Verbal cues for precautions/safety;Verbal cues for technique  Trunk/Postural Assessment  Cervical Assessment Cervical Assessment: Exceptions to [facility] (hard collar) Thoracic Assessment Thoracic Assessment: Within Functional Limits Lumbar Assessment Lumbar Assessment: Exceptions to [facility] (pelvic fx) Postural Control Postural Control: Deficits on evaluation Righting Reactions: delayed  Balance Balance Balance Assessed: Yes Static Sitting Balance Static Sitting - Balance Support: No upper extremity supported Static Sitting - Level of Assistance: 7: Independent Dynamic Sitting Balance Dynamic Sitting - Balance Support: Feet supported;During functional activity Dynamic Sitting - Level of Assistance: 6: Modified independent (Device/Increase time) Static Standing Balance Static Standing - Balance Support:  Bilateral upper extremity supported Static Standing - Level of Assistance: 5: Stand by assistance Dynamic Standing Balance Dynamic Standing - Balance Support: Bilateral upper extremity supported Dynamic Standing - Level of Assistance: 5: Stand by assistance Extremity/Trunk Assessment RUE Assessment RUE Assessment: Exceptions to [facility] RUE AROM (degrees) Overall AROM Right Upper Extremity: Deficits;Due to pain;Due to precautions LUE Assessment LUE Assessment: Within Functional Limits  See Function Navigator for Current Functional Status.  Leotis Shames [facility] 08/21/2016, 3:17 PM
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
## 2016-08-21 NOTE — Progress Notes (Signed)
Occupational Therapy Session Note  Patient Details  Name: Steven [name] MRN: [id number] Date of Birth: 01/21/1991  Today's Date: 08/21/2016 OT Individual Time: 8295-62131030-1125 OT Individual Time Calculation (min): 55 min     Short Term Goals: Week 1:  OT Short Term Goal 1 (Week 1): Pt will complete 2 grooming tasks in standing with supervision OT Short Term Goal 2 (Week 1): Pt will don pants with set-up/supervision using AE PRN OT Short Term Goal 3 (Week 1): Pt will complete simulated tub/shower combination transfer utilizing tub transfer bench in prep for d/c home.  OT Short Term Goal 4 (Week 1): Pt will wash UB with set-up assist  Skilled Therapeutic Interventions/Progress Updates:    Pt initially engaged in bathing/dressing tasks at w/c/recliner level.  Pt completes all tasks without assistance.  Pt requires more than a reasonable amount of time to complete tasks with verbal cues to adhere to RLE weight bearing precautions.  Pt practiced toilet transfers, toileting, and tub transfers, requiring no assistance.  Pt instructed in R hand activities/exercises with focus on gross grasp/release.  Pt issued handout and foam blocks.  Continued discharge planning and education.  Pt stated he was ready to go home and his focus was on increased functional use of RUE so he could return to work.  Educated pt on importance of maintaining C-collar and Bledsoe brace in place.  Pt verbalized understanding.   Therapy Documentation Precautions:  Precautions Precautions: Fall, Cervical Type of Shoulder Precautions: WBAT, NO Rt shoulder Abduction Required Braces or Orthoses: Cervical Brace, Other Brace/Splint Cervical Brace: Hard collar, At all times Other Brace/Splint: knee brace on at all times Restrictions Weight Bearing Restrictions: Yes RUE Weight Bearing: Weight bearing as tolerated RLE Weight Bearing: Non weight bearing General:   Vital Signs:   Pain: Pain Assessment Pain Score: 0-No pain  (pressure/ discomfort in pelvic area) Pain Type: Acute pain Pain Location: Pelvis Patients Stated Pain Goal: 0 Pain Intervention(s): Medication (See eMAR) ADL:   Exercises:   Other Treatments:    See Function Navigator for Current Functional Status.   Therapy/Group: Individual Therapy  Rich BraveLanier, Mayes Sangiovanni Chappell 08/21/2016, 11:30 AM

## 2016-08-21 NOTE — Plan of Care (Signed)
Problem: RH Balance Goal: LTG Patient will maintain dynamic standing balance (PT) LTG:  Patient will maintain dynamic standing balance with assistance during mobility activities (PT)  Outcome: Completed/Met Date Met: 08/21/16 With PFRW  Problem: RH Bed to Chair Transfers Goal: LTG Patient will perform bed/chair transfers w/assist (PT) LTG: Patient will perform bed/chair transfers with assistance, with/without cues (PT).  Outcome: Not Met (add Reason) With [place]; Inability to maintain NWB RLE without cuing  Problem: RH Car Transfers Goal: LTG Patient will perform car transfers with assist (PT) LTG: Patient will perform car transfers with assistance (PT).  Outcome: Completed/Met Date Met: 08/21/16 With PFRW  Problem: RH Ambulation Goal: LTG Patient will ambulate in controlled environment (PT) LTG: Patient will ambulate in a controlled environment, # of feet with assistance (PT).  Outcome: Completed/Met Date Met: 08/21/16 150 ft with PFRW Goal: LTG Patient will ambulate in home environment (PT) LTG: Patient will ambulate in home environment, # of feet with assistance (PT).  Outcome: Completed/Met Date Met: 08/21/16 50 ft with PFRW  Problem: RH Wheelchair Mobility Goal: LTG Patient will propel w/c in controlled environment (PT) LTG: Patient will propel wheelchair in controlled environment, # of feet with assist (PT)  Outcome: Completed/Met Date Met: 08/21/16 150 ft Goal: LTG Patient will propel w/c in home environment (PT) LTG: Patient will propel wheelchair in home environment, # of feet with assistance (PT).  Outcome: Completed/Met Date Met: 08/21/16 50 ft  Problem: RH Stairs Goal: LTG Patient will ambulate up and down stairs w/assist (PT) LTG: Patient will ambulate up and down # of stairs with assistance (PT)  Outcome: Adequate for Discharge Caregivers to bump pt up steps in w/c

## 2016-08-21 NOTE — Progress Notes (Signed)
Checked on patient and found without aspen collar on. Asked patient to put it back on and educated about need to keep it on. Will pass to oncoming RN.

## 2016-08-21 NOTE — Progress Notes (Signed)
Physical Therapy Discharge Summary  Patient Details  Name: Steven [name] MRN: [id number] Date of Birth: 26-Mar-1991  Today's Date: 08/21/2016 PT Individual Time: 0905-1004 and 3875-6433 PT Individual Time Calculation (min): 59 min and 83 min    Patient has met 9 of 10 long term goals due to improved activity tolerance, improved balance, improved postural control, increased strength, decreased pain, ability to compensate for deficits and improved awareness.  Patient to discharge at an ambulatory level Supervision and w/c level at mod I.   Patient's aunt has been educated and trained to be a caregiver for this pt. Both pt and aunt would benefit from further training for safety with functional tasks but both report comfort with tasks and are prepared for pt's d/c home.   Reasons goals not met: pt with decreased safety awareness with transfers and decreased ability to maintain NWB during task  Recommendation:  Patient will benefit from ongoing skilled PT services in home health setting to continue to advance safe functional mobility, address ongoing impairments in decreased endurance, decreased strength in RLE, decreased ability to negotiate stairs, decreased safety awareness and ability to maintain precautions during functional tasks, and minimize fall risk.  Equipment: PFRW, 16x16 w/c with elevating leg rests  Reasons for discharge: treatment goals met  Patient/family agrees with progress made and goals achieved: Yes  Skilled PT Treatment: Treatment 1: Pt received in recliner with aunt present for caregiver training. Pt noted 0/10 pain at rest & agreeable to tx. Educated pt & aunt on positioning & adjusting RLE brace with pt able to instruct aunt 50% of the time; pt's aunt able to demonstrate adjustment of brace. Pt able to verbally recall all precautions (cervical, RUE, and RLE) but unable to incorporate these into functional tasks without min/moderate cuing from PT. Educated pt & aunt on need  for pt to utilize [place] for all stand pivot transfers with both pt & aunt requiring cuing of this later during session. PT provided demonstration and pt & aunt return demonstrated stand pivot transfers w/c<>recliner, w/c<>car with supervision overall. Pt requires cuing for positioning of w/c & PFRW as well as need to lock w/c brakes. Educated aunt on breakdown of 3-in-1 BSC, PFRW, and w/c, as well as management of w/c leg rests. Provided demonstration & education of stair negotiation via bumping pt up in w/c with aunt assisting & then return demonstrating; educated them on increased safety of negotiating stairs in this manner. Pt completed car transfer from van simulated height with [place] & supervision. At end of session pt left sitting in w/c in room with aunt present. Pt and aunt would benefit from further transfer training & caregiver education respectively, but both report feeling comfort with task and pt's d/c home tomorrow. Educated them to remove all throw rugs from house to reduce tripping hazards. Educated pt on need to maintain NWB RLE during sit<>Stand transfers and provided demonstration on how to complete this; pt with fair demo.   During session pt completed toilet transfer with supervision with poor demo of maintaining weight bearing precautions.  Treatment 2: Pt received asleep in bed but easily awakened with verbal stimulation. Pt denied c/o pain at rest & agreeable to tx. Therapist provided total assist for adjustment of RLE brace as it continues to slide down pt's leg. Pt ambulated bed>w/c with PFRW & supervision. In rehab apartment pt able to demonstrate bed mobility with mod I for sit>supine & Rolling but required supervision for R sidelying>sit as pt attempted to abduct RUE  as he pushed up with extremity & required cuing not to do so. Pt ambulated in rehab apartment, over ramp, and over mulch with PFRW & supervision overall. Pt declined propelling w/c off unit (outside or gift shop) & instead  requested to perform HEP. Pt performed the following BLE strengthening exercises: ankle pumps, short arc quads, heel slides, hip abduction, straight leg raises, and quad/glute sets. Therapist performed PROM stretch to pt's L hamstring & educated pt to perform stretch for 30 seconds, 3-5 repetitions. At end of session pt left bed in room with all needs within reach. Pt voices no concerns regarding d/c home.  During session pt demonstrated improving ability to complete sit<>stand transfers while maintaining NWB RLE.  PT Discharge Precautions/Restrictions Precautions Precautions: Fall;Cervical Type of Shoulder Precautions: NO RUE shoulder abduction, WBAT Required Braces or Orthoses: Other Brace/Splint;Cervical Brace Cervical Brace: Hard collar;At all times Other Brace/Splint: hinged brace on RLE at all times Restrictions Weight Bearing Restrictions: Yes RUE Weight Bearing: Weight bearing as tolerated RLE Weight Bearing: Non weight bearing  Pain Pain Assessment Pain Assessment: No/denies pain (denies c/o pain at rest)   Vision/Perception  Pt denies any changes in baseline vision since admission to [facility]. Cognition Arousal/Alertness: Awake/alert Orientation Level: Oriented X4 Attention: Sustained;Selective Sustained Attention: Appears intact Selective Attention: Appears intact Safety/Judgment: Impaired (decreased awareness of safety precautions)   Sensation Sensation Light Touch: Appears Intact Proprioception: Appears Intact Coordination Gross Motor Movements are Fluid and Coordinated: Yes  Mobility Bed Mobility Bed Mobility: Rolling Left;Rolling Right;Supine to Sit;Sit to Supine Rolling Right: 6: Modified independent (Device/Increase time) Rolling Left: 6: Modified independent (Device/Increase time) Supine to Sit: 5: Supervision Supine to Sit Details: Verbal cues for precautions/safety Sit to Supine: 6: Modified independent (Device/Increase time) Transfers Transfers: Yes Sit  to Stand: 5: Supervision (with Right PFRW) Sit to Stand Details: Verbal cues for precautions/safety;Verbal cues for technique Stand to Sit: 5: Supervision Stand to Sit Details (indicate cue type and reason): Verbal cues for precautions/safety;Verbal cues for technique Stand Pivot Transfers: 5: Supervision (with Right PFRW) Stand Pivot Transfer Details: Verbal cues for technique;Verbal cues for precautions/safety   Locomotion  Ambulation Ambulation: Yes Ambulation/Gait Assistance: 5: Supervision Ambulation Distance (Feet): 150 Feet Assistive device: Right platform walker Gait Gait: Yes Gait Pattern: Step-to pattern (NWB RLE) Stairs / Additional Locomotion Stairs: Yes Stairs Assistance: 1: +2 Total assist (in w/c) Ramp: 5: Supervision (with PFRW) Wheelchair Mobility Wheelchair Mobility: Yes Wheelchair Assistance: 6: Modified independent (Device/Increase time) Wheelchair Propulsion: Left upper extremity;Left lower extremity Wheelchair Parts Management: Supervision/cueing Distance: 150 ft   Balance Balance Balance Assessed: Yes Static Sitting Balance Static Sitting - Balance Support: No upper extremity supported Static Sitting - Level of Assistance: 7: Independent Static Standing Balance Static Standing - Balance Support: Bilateral upper extremity supported Static Standing - Level of Assistance: 5: Stand by assistance Dynamic Standing Balance Dynamic Standing - Balance Support: Bilateral upper extremity supported Dynamic Standing - Level of Assistance: 5: Stand by assistance   Extremity Assessment  RLE Assessment RLE Assessment: Within Functional Limits (limited by R LE brace) LLE Assessment LLE Assessment: Within Functional Limits   See Function Navigator for Current Functional Status.  Waunita Schooner 08/21/2016, 5:12 PM

## 2016-08-22 MED ORDER — GABAPENTIN 100 MG PO CAPS: 200.0000 mg | ORAL_CAPSULE | Freq: Three times a day (TID) | ORAL | 1 refills | Status: DC

## 2016-08-22 MED ORDER — BETHANECHOL CHLORIDE 10 MG PO TABS: 10.0000 mg | ORAL_TABLET | Freq: Two times a day (BID) | ORAL | 0 refills | Status: DC

## 2016-08-22 MED ORDER — TRAMADOL HCL 50 MG PO TABS: 100.0000 mg | ORAL_TABLET | Freq: Four times a day (QID) | ORAL | 0 refills | Status: DC

## 2016-08-22 MED ORDER — OXYCODONE HCL 10 MG PO TABS: 10.0000 mg | ORAL_TABLET | ORAL | 0 refills | Status: DC | PRN

## 2016-08-22 MED ORDER — MORPHINE SULFATE ER 15 MG PO TBCR: 15.0000 mg | EXTENDED_RELEASE_TABLET | Freq: Two times a day (BID) | ORAL | 0 refills | Status: DC

## 2016-08-22 MED ORDER — CLONAZEPAM 0.5 MG PO TABS: 0.5000 mg | ORAL_TABLET | Freq: Two times a day (BID) | ORAL | 0 refills | Status: DC
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
## 2016-08-22 NOTE — Progress Notes (Signed)
Pt. Got d/c papers and instructions,pt. Ready to go home with his family

## 2016-08-22 NOTE — Discharge Instructions (Signed)
Inpatient Rehab Discharge Instructions  Steven [name] Discharge date and time: No discharge date for patient encounter.   Activities/Precautions/ Functional Status: Activity: Nonweightbearing right lower extremity with hinged knee brace. Cervical collar at all times Diet: regular diet Wound Care: keep wound clean and dry Functional status:  ___ No restrictions     ___ Walk up steps independently ___ 24/7 supervision/assistance   ___ Walk up steps with assistance ___ Intermittent supervision/assistance  ___ Bathe/dress independently ___ Walk with walker     _x__ Bathe/dress with assistance ___ Walk Independently    ___ Shower independently ___ Walk with assistance    ___ Shower with assistance ___ No alcohol     ___ Return to work/school ________  Special Instructions: No driving or smoking  COMMUNITY REFERRALS UPON DISCHARGE:    Home Health:   PT, OT   Agency:[facility] [phone]  Date of last service:08/22/2016  Medical Equipment/Items Ordered:WHEELCHAIR, RIGHT PLATFORM Levan HurstROLLING WALKER, BEDSIDE COMMODE  Agency/Supplier:[facility]  [phone]  OTHER: MATCH PROGRAM FOR MEDICATION ASSISTANCE  My questions have been answered and I understand these instructions. I will adhere to these goals and the provided educational materials after my discharge from the hospital.  Patient/Caregiver Signature _______________________________ Date __________  Clinician Signature _______________________________________ Date __________  Please bring this form and your medication list with you to all your follow-up doctor's appointments.

## 2016-08-22 NOTE — Progress Notes (Signed)
Social Work  Discharge Note  The overall goal for the admission was met for:   Discharge location: Yes - home with grandmother  Length of Stay: Yes - 8 days  Discharge activity level: Yes - supervision to mod independent  Home/community participation: Yes - supervision to [employer] provided included: MD, RD, PT, OT, RN, TR, Pharmacy and SW  Financial Services: Referred to [facility] for primary care home  Follow-up services arranged: Home Health: PT, OT via [facility], DME: 16x16 lightweight w/c with ELRs, cushion, right platform attachment rolling walker, 3n1 via [facility], Other: Referred for MATCH assistance and Patient/Family has no preference for HH/DME agencies  Comments (or additional information):  Patient/Family verbalized understanding of follow-up arrangements: Yes  Individual responsible for coordination of the follow-up plan: pt  Confirmed correct DME delivered: Ashwini Jago 08/22/2016    Dangelo Guzzetta
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
## 2016-08-26 ENCOUNTER — Ambulatory Visit: Payer: No Typology Code available for payment source | Attending: Internal Medicine | Admitting: Physician Assistant

## 2016-08-26 ENCOUNTER — Encounter: Payer: Self-pay | Admitting: Physician Assistant

## 2016-08-26 VITALS — BP 114/56 | HR 102 | Temp 98.2°F | Resp 18 | Ht 69.0 in | Wt 134.0 lb

## 2016-08-26 DIAGNOSIS — D62 Acute posthemorrhagic anemia: Secondary | ICD-10-CM

## 2016-08-26 DIAGNOSIS — S32810A Multiple fractures of pelvis with stable disruption of pelvic ring, initial encounter for closed fracture: Secondary | ICD-10-CM | POA: Insufficient documentation

## 2016-08-26 DIAGNOSIS — S82141A Displaced bicondylar fracture of right tibia, initial encounter for closed fracture: Secondary | ICD-10-CM | POA: Insufficient documentation

## 2016-08-26 DIAGNOSIS — D72820 Lymphocytosis (symptomatic): Secondary | ICD-10-CM | POA: Insufficient documentation

## 2016-08-26 DIAGNOSIS — E46 Unspecified protein-calorie malnutrition: Secondary | ICD-10-CM | POA: Insufficient documentation

## 2016-08-26 DIAGNOSIS — S5410XA Injury of median nerve at forearm level, unspecified arm, initial encounter: Secondary | ICD-10-CM | POA: Insufficient documentation

## 2016-08-26 DIAGNOSIS — T07XXXA Unspecified multiple injuries, initial encounter: Secondary | ICD-10-CM

## 2016-08-26 DIAGNOSIS — S32599A Other specified fracture of unspecified pubis, initial encounter for closed fracture: Secondary | ICD-10-CM | POA: Insufficient documentation

## 2016-08-26 DIAGNOSIS — Z23 Encounter for immunization: Secondary | ICD-10-CM

## 2016-08-26 DIAGNOSIS — M792 Neuralgia and neuritis, unspecified: Secondary | ICD-10-CM

## 2016-08-26 DIAGNOSIS — K5900 Constipation, unspecified: Secondary | ICD-10-CM | POA: Insufficient documentation

## 2016-08-26 DIAGNOSIS — R339 Retention of urine, unspecified: Secondary | ICD-10-CM | POA: Insufficient documentation

## 2016-08-26 DIAGNOSIS — S060X0A Concussion without loss of consciousness, initial encounter: Secondary | ICD-10-CM | POA: Insufficient documentation

## 2016-08-26 MED ORDER — FERROUS GLUCONATE 324 (38 FE) MG PO TABS: 324.0000 mg | ORAL_TABLET | Freq: Every day | ORAL | 0 refills | Status: DC

## 2016-08-26 MED ORDER — GABAPENTIN 300 MG PO CAPS: 300.0000 mg | ORAL_CAPSULE | Freq: Three times a day (TID) | ORAL | 3 refills | Status: DC

## 2016-08-26 MED FILL — GABAPENTIN 300 MG CAPSULE: 300 | 30 days supply | Qty: 90 | Fill #0

## 2016-08-26 NOTE — Progress Notes (Signed)
Patient is here for HFU  Patient denies pain at this time.  Patient tolerated flu vaccine well today.

## 2016-08-26 NOTE — Patient Instructions (Signed)
Drink 80-100 ounces of water daily 
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
## 2016-08-27 ENCOUNTER — Telehealth: Payer: Self-pay | Admitting: *Deleted

## 2016-08-27 NOTE — Telephone Encounter (Signed)
Mindi JunkerMarsha, OT, [facility] left a message asking for verbal orders for 2 more visits (1week1 X2) to address adaptive equipment and home exercise program. Contact number: [phone]

## 2016-08-28 ENCOUNTER — Telehealth: Payer: Self-pay | Admitting: Physical Medicine & Rehabilitation

## 2016-08-28 ENCOUNTER — Telehealth: Payer: Self-pay | Admitting: *Deleted

## 2016-08-28 NOTE — Telephone Encounter (Signed)
Requesting orders for OT 1w3,   Approval given.

## 2016-08-28 NOTE — Telephone Encounter (Signed)
Approval given 1w2

## 2016-08-28 NOTE — Telephone Encounter (Signed)
This was approved on previous call

## 2016-08-28 NOTE — Telephone Encounter (Signed)
[facility] [name] requesting additional visits 1 more this week then 1w2.  Please call her at [phone].

## 2016-09-02 ENCOUNTER — Telehealth: Payer: Self-pay | Admitting: *Deleted

## 2016-09-02 NOTE — Telephone Encounter (Signed)
Clide CliffRicky called because he just got our of hospital and papers say follow up with Dr Riley KillSwartz after hospital.  I called and left appointment information on VM 10/02/16 @3 :20 arriving by 3:00

## 2016-09-03 ENCOUNTER — Telehealth: Payer: Self-pay | Admitting: Physical Medicine & Rehabilitation

## 2016-09-03 NOTE — Telephone Encounter (Signed)
Patient is requesting refills on his pain medication.  Please call patient at [phone].

## 2016-09-12 MED FILL — GABAPENTIN 300 MG CAPSULE: 300 | 30 days supply | Qty: 90 | Fill #1

## 2016-10-02 ENCOUNTER — Encounter: Payer: Self-pay | Admitting: Physical Medicine & Rehabilitation

## 2016-10-02 ENCOUNTER — Encounter: Payer: Self-pay | Attending: Physical Medicine & Rehabilitation | Admitting: Physical Medicine & Rehabilitation

## 2016-10-02 VITALS — BP 121/64 | HR 68

## 2016-10-02 DIAGNOSIS — S42301A Unspecified fracture of shaft of humerus, right arm, initial encounter for closed fracture: Secondary | ICD-10-CM | POA: Insufficient documentation

## 2016-10-02 DIAGNOSIS — Z5189 Encounter for other specified aftercare: Secondary | ICD-10-CM | POA: Insufficient documentation

## 2016-10-02 DIAGNOSIS — R531 Weakness: Secondary | ICD-10-CM | POA: Insufficient documentation

## 2016-10-02 DIAGNOSIS — F17290 Nicotine dependence, other tobacco product, uncomplicated: Secondary | ICD-10-CM | POA: Insufficient documentation

## 2016-10-02 DIAGNOSIS — S32599S Other specified fracture of unspecified pubis, sequela: Secondary | ICD-10-CM

## 2016-10-02 DIAGNOSIS — M79641 Pain in right hand: Secondary | ICD-10-CM | POA: Insufficient documentation

## 2016-10-02 DIAGNOSIS — F0781 Postconcussional syndrome: Secondary | ICD-10-CM | POA: Insufficient documentation

## 2016-10-02 DIAGNOSIS — F419 Anxiety disorder, unspecified: Secondary | ICD-10-CM | POA: Insufficient documentation

## 2016-10-02 DIAGNOSIS — S32591A Other specified fracture of right pubis, initial encounter for closed fracture: Secondary | ICD-10-CM | POA: Insufficient documentation

## 2016-10-02 DIAGNOSIS — R200 Anesthesia of skin: Secondary | ICD-10-CM | POA: Insufficient documentation

## 2016-10-02 DIAGNOSIS — S5411XA Injury of median nerve at forearm level, right arm, initial encounter: Secondary | ICD-10-CM | POA: Insufficient documentation

## 2016-10-02 DIAGNOSIS — S4411XS Injury of median nerve at upper arm level, right arm, sequela: Secondary | ICD-10-CM

## 2016-10-02 DIAGNOSIS — S32591D Other specified fracture of right pubis, subsequent encounter for fracture with routine healing: Secondary | ICD-10-CM

## 2016-10-02 DIAGNOSIS — S42215S Unspecified nondisplaced fracture of surgical neck of left humerus, sequela: Secondary | ICD-10-CM

## 2016-10-02 DIAGNOSIS — T1490XA Injury, unspecified, initial encounter: Secondary | ICD-10-CM | POA: Insufficient documentation

## 2016-10-02 MED ORDER — GABAPENTIN 600 MG PO TABS: 600.0000 mg | ORAL_TABLET | Freq: Three times a day (TID) | ORAL | 3 refills | Status: DC

## 2016-10-02 NOTE — Patient Instructions (Signed)
GABAPENTIN:  300-300-600 FOR 4 DAYS THEN 600-300-600 FOR THE NEXT 4 DAYS, THEN 600MG  THREE X DAILY.   PLEASE CALL ME WITH ANY PROBLEMS OR QUESTIONS [phone])   HAPPY HOLIDAYS!!!!                    *                * *             *   *   *         *  *   *  *  *     *  *  *  *  *  *  * *  *  *  *  *  *  *  *  *  * *               *  *               *  *               *  *
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
## 2016-10-08 MED FILL — GABAPENTIN 300 MG CAPSULE: 300 | 30 days supply | Qty: 90 | Fill #2

## 2016-10-14 ENCOUNTER — Telehealth: Payer: Self-pay | Admitting: Physical Medicine & Rehabilitation

## 2016-10-14 NOTE — Telephone Encounter (Signed)
Letter written

## 2016-10-14 NOTE — Telephone Encounter (Signed)
patient needs a letter stating that he is unable to return to work.  he is trying get goverment assistance and they are requesting a letter from his physician as to why he cant work and for how long. Patient will pick up when letter is complete.

## 2016-10-14 NOTE — Telephone Encounter (Signed)
[employer] notified.

## 2016-10-30 MED FILL — GABAPENTIN 300 MG CAPSULE: 300 | 30 days supply | Qty: 90 | Fill #3

## 2016-11-01 ENCOUNTER — Encounter: Payer: Self-pay | Attending: Physical Medicine & Rehabilitation | Admitting: Registered Nurse

## 2016-11-01 DIAGNOSIS — Z5189 Encounter for other specified aftercare: Secondary | ICD-10-CM | POA: Insufficient documentation

## 2016-11-01 DIAGNOSIS — S32591A Other specified fracture of right pubis, initial encounter for closed fracture: Secondary | ICD-10-CM | POA: Insufficient documentation

## 2016-11-01 DIAGNOSIS — S42301A Unspecified fracture of shaft of humerus, right arm, initial encounter for closed fracture: Secondary | ICD-10-CM | POA: Insufficient documentation

## 2016-11-01 DIAGNOSIS — T1490XA Injury, unspecified, initial encounter: Secondary | ICD-10-CM | POA: Insufficient documentation

## 2016-11-01 DIAGNOSIS — F0781 Postconcussional syndrome: Secondary | ICD-10-CM | POA: Insufficient documentation

## 2016-11-01 DIAGNOSIS — R200 Anesthesia of skin: Secondary | ICD-10-CM | POA: Insufficient documentation

## 2016-11-01 DIAGNOSIS — S5411XA Injury of median nerve at forearm level, right arm, initial encounter: Secondary | ICD-10-CM | POA: Insufficient documentation

## 2016-11-01 DIAGNOSIS — F17290 Nicotine dependence, other tobacco product, uncomplicated: Secondary | ICD-10-CM | POA: Insufficient documentation

## 2016-11-01 DIAGNOSIS — R531 Weakness: Secondary | ICD-10-CM | POA: Insufficient documentation

## 2016-11-01 DIAGNOSIS — F419 Anxiety disorder, unspecified: Secondary | ICD-10-CM | POA: Insufficient documentation

## 2016-11-01 DIAGNOSIS — M79641 Pain in right hand: Secondary | ICD-10-CM | POA: Insufficient documentation

## 2016-11-20 ENCOUNTER — Encounter: Payer: Self-pay | Attending: Physical Medicine & Rehabilitation | Admitting: Physical Medicine & Rehabilitation

## 2016-11-20 ENCOUNTER — Encounter: Payer: Self-pay | Admitting: Physical Medicine & Rehabilitation

## 2016-11-20 VITALS — BP 113/67 | HR 67 | Resp 14

## 2016-11-20 DIAGNOSIS — S82141A Displaced bicondylar fracture of right tibia, initial encounter for closed fracture: Secondary | ICD-10-CM | POA: Insufficient documentation

## 2016-11-20 DIAGNOSIS — S12600A Unspecified displaced fracture of seventh cervical vertebra, initial encounter for closed fracture: Secondary | ICD-10-CM | POA: Insufficient documentation

## 2016-11-20 DIAGNOSIS — R200 Anesthesia of skin: Secondary | ICD-10-CM | POA: Insufficient documentation

## 2016-11-20 DIAGNOSIS — S32591D Other specified fracture of right pubis, subsequent encounter for fracture with routine healing: Secondary | ICD-10-CM

## 2016-11-20 DIAGNOSIS — Z5189 Encounter for other specified aftercare: Secondary | ICD-10-CM | POA: Insufficient documentation

## 2016-11-20 DIAGNOSIS — R531 Weakness: Secondary | ICD-10-CM | POA: Insufficient documentation

## 2016-11-20 DIAGNOSIS — S4411XS Injury of median nerve at upper arm level, right arm, sequela: Secondary | ICD-10-CM

## 2016-11-20 DIAGNOSIS — F17210 Nicotine dependence, cigarettes, uncomplicated: Secondary | ICD-10-CM | POA: Insufficient documentation

## 2016-11-20 DIAGNOSIS — S5411XA Injury of median nerve at forearm level, right arm, initial encounter: Secondary | ICD-10-CM | POA: Insufficient documentation

## 2016-11-20 DIAGNOSIS — S129XXS Fracture of neck, unspecified, sequela: Secondary | ICD-10-CM

## 2016-11-20 DIAGNOSIS — S42301A Unspecified fracture of shaft of humerus, right arm, initial encounter for closed fracture: Secondary | ICD-10-CM | POA: Insufficient documentation

## 2016-11-20 DIAGNOSIS — Z9889 Other specified postprocedural states: Secondary | ICD-10-CM | POA: Insufficient documentation

## 2016-11-20 DIAGNOSIS — S22009A Unspecified fracture of unspecified thoracic vertebra, initial encounter for closed fracture: Secondary | ICD-10-CM | POA: Insufficient documentation

## 2016-11-20 DIAGNOSIS — F0781 Postconcussional syndrome: Secondary | ICD-10-CM | POA: Insufficient documentation

## 2016-11-20 MED ORDER — GABAPENTIN 600 MG PO TABS: 300.0000 mg | ORAL_TABLET | Freq: Every day | ORAL | 3 refills | Status: DC
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
## 2016-11-20 NOTE — Patient Instructions (Signed)
PLEASE FEEL FREE TO CALL OUR OFFICE WITH ANY PROBLEMS OR QUESTIONS ([phone])      

## 2016-12-04 ENCOUNTER — Ambulatory Visit: Payer: No Typology Code available for payment source | Attending: Orthopedic Surgery | Admitting: Physical Therapy

## 2017-01-14 ENCOUNTER — Telehealth: Payer: Self-pay | Admitting: Physical Therapy

## 2017-01-14 NOTE — Telephone Encounter (Signed)
12/04/16 no show for PT eval

## 2017-07-23 IMAGING — RF DG C-ARM 61-120 MIN
1 series · 4 of 4 positions shown · non-contrast
Comparison: 08/06/2016

CLINICAL DATA: Right humeral fracture

EXAM:
RIGHT HUMERUS - 2+ VIEW; DG C-ARM 61-120 MIN

[Series 1: run · 4 of 4 slices shown]
[im 1/4]
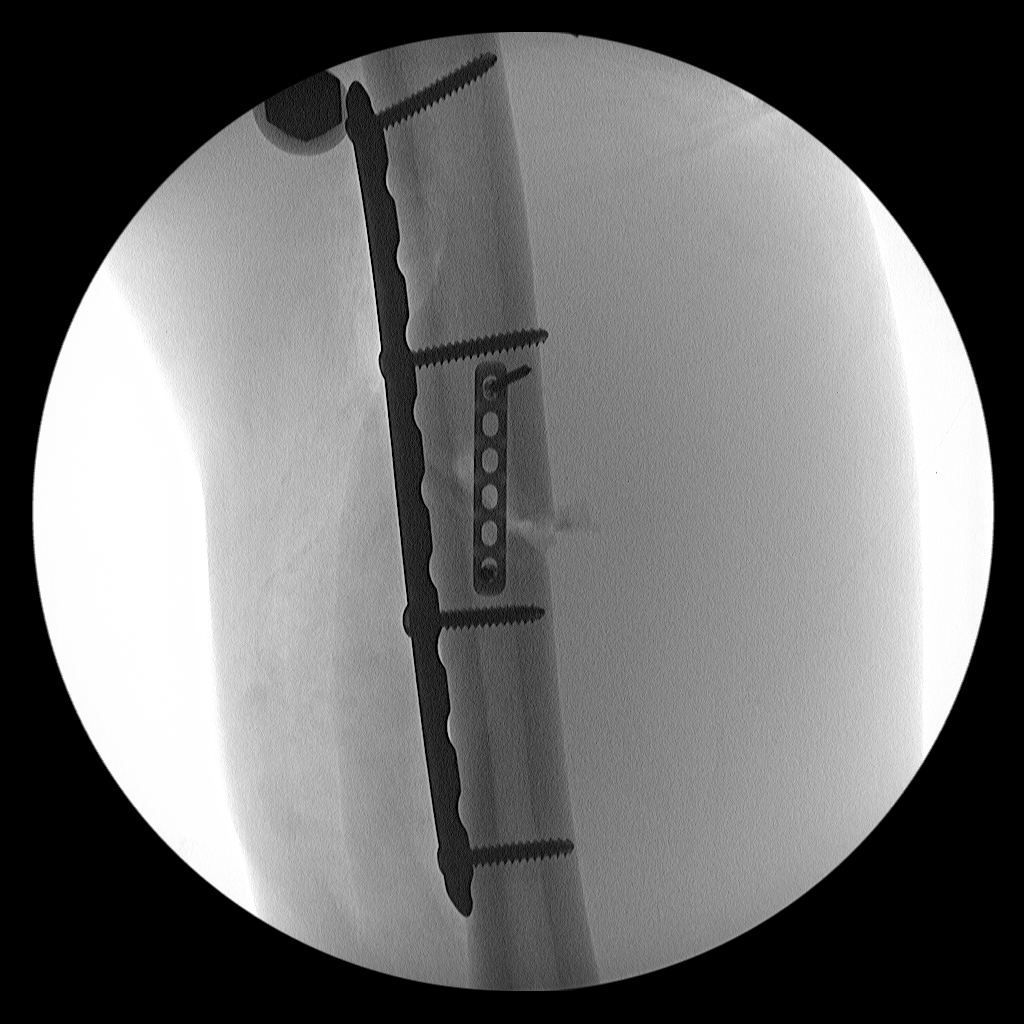
[im 2/4]
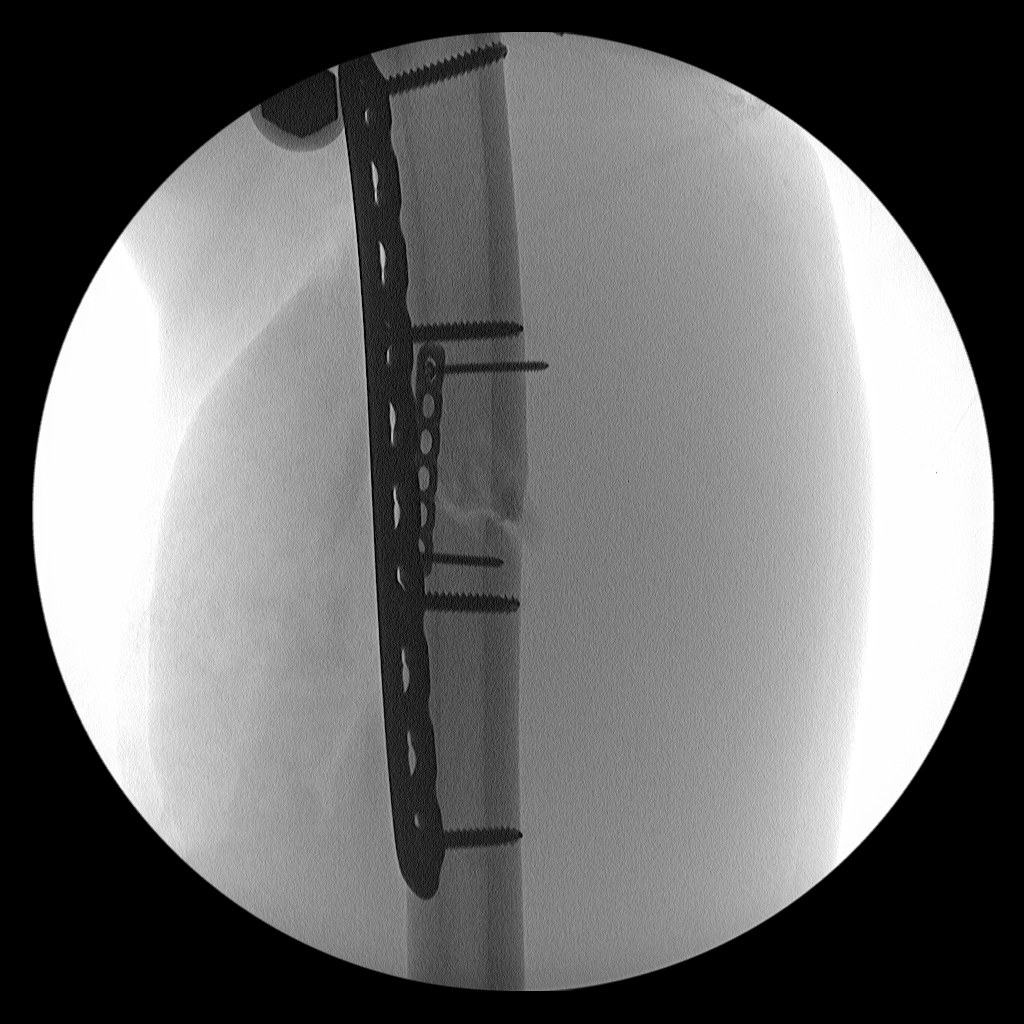
[im 3/4]
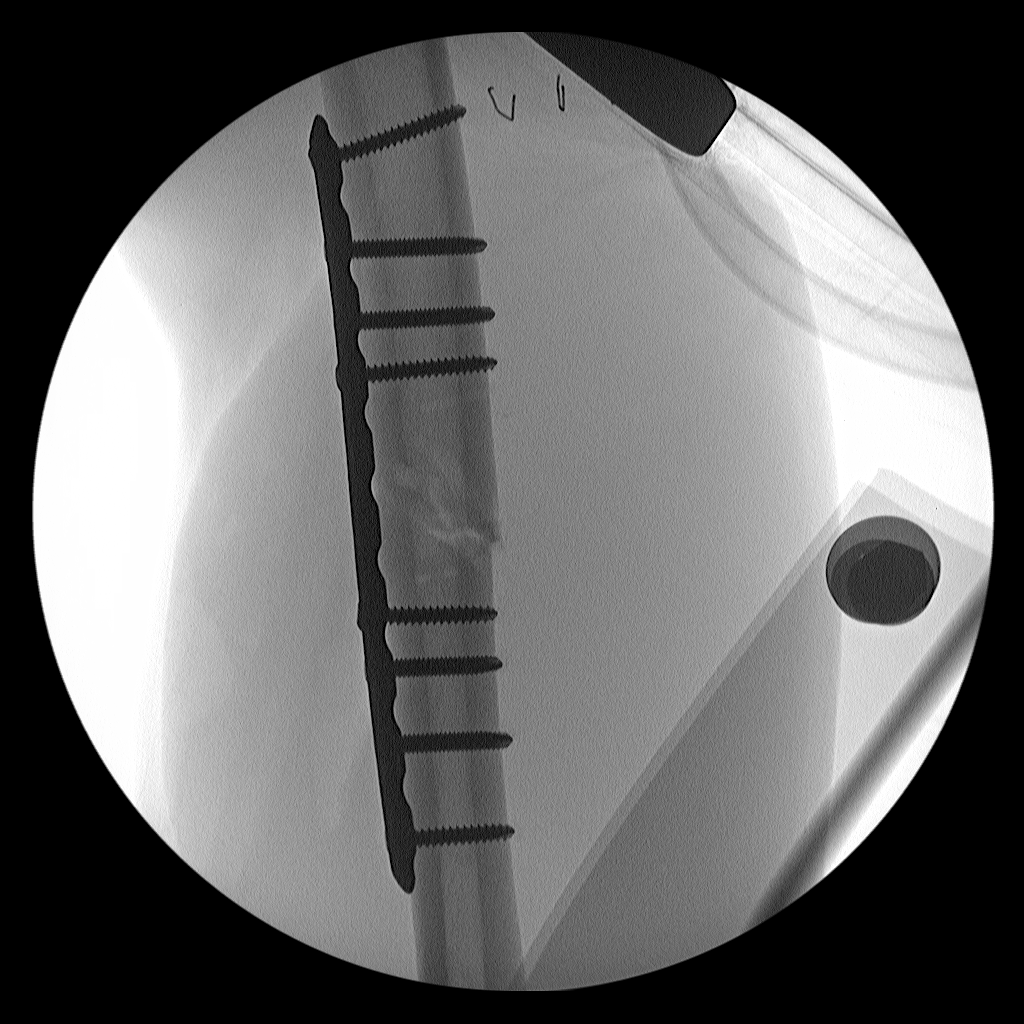
[im 4/4]
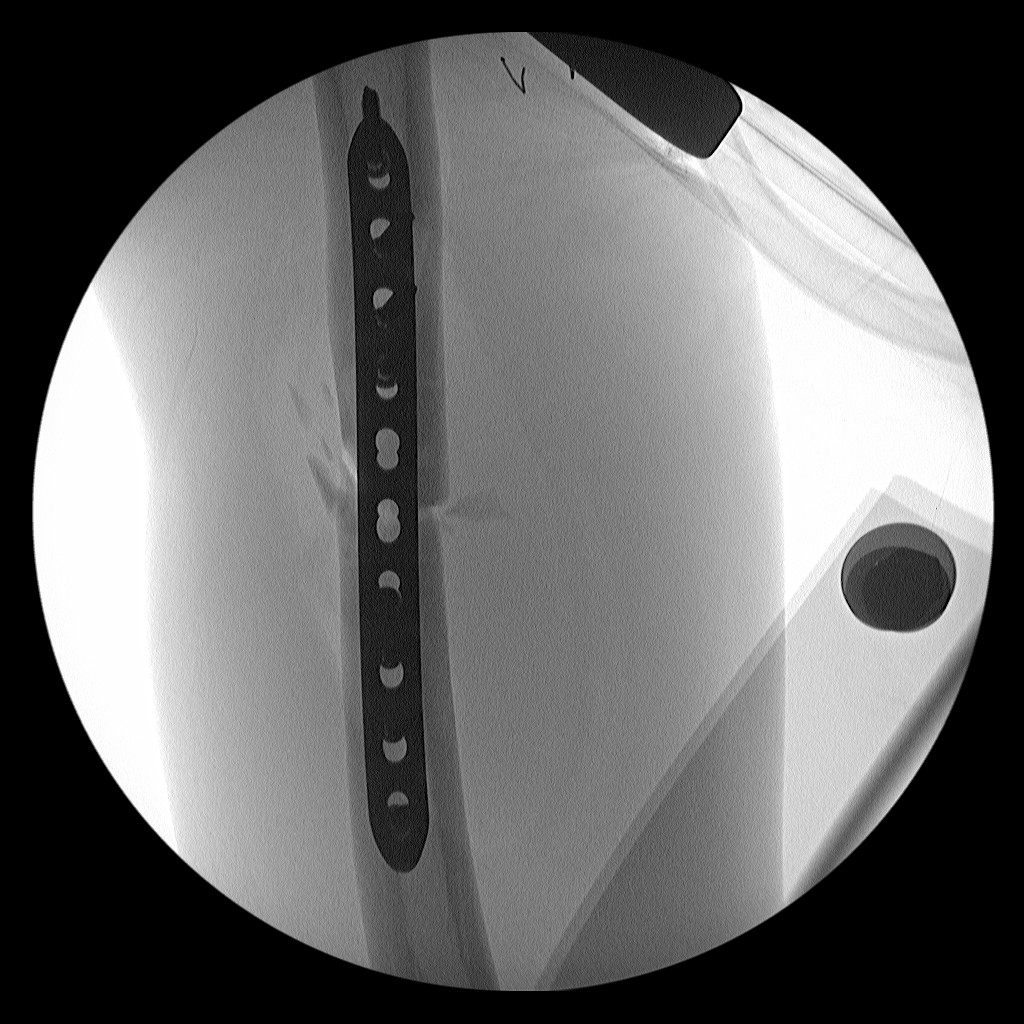

[4 of 4 positions shown; findings below may reference images not displayed]

FLUOROSCOPY TIME:  Radiation Exposure Index (as provided by the
fluoroscopic device): Not available

If the device does not provide the exposure index:

Fluoroscopy Time:  17 seconds

Number of Acquired Images:  4
FINDINGS: Fixation side plate is noted along the right humeral fracture.
Fracture fragments are in near anatomic alignment.
IMPRESSION: ORIF of right humeral fracture.

## 2020-01-05 ENCOUNTER — Other Ambulatory Visit: Payer: Self-pay

## 2020-01-05 ENCOUNTER — Ambulatory Visit (HOSPITAL_COMMUNITY)
Admission: EM | Admit: 2020-01-05 | Discharge: 2020-01-05 | Disposition: A | Discharge status: Home / Self Care | Payer: Self-pay

## 2020-01-05 NOTE — ED Notes (Addendum)
Steven [name], patient access reported patient did not want to pay and was asking about cheaper options for std testing.  Patient left per [facility], patient access.

## 2020-01-11 ENCOUNTER — Ambulatory Visit (HOSPITAL_COMMUNITY)
Admission: EM | Admit: 2020-01-11 | Discharge: 2020-01-11 | Disposition: A | Discharge status: Home / Self Care | Payer: Self-pay | Attending: Urgent Care | Admitting: Urgent Care

## 2020-01-11 ENCOUNTER — Encounter (HOSPITAL_COMMUNITY): Payer: Self-pay

## 2020-01-11 ENCOUNTER — Other Ambulatory Visit: Payer: Self-pay

## 2020-01-11 DIAGNOSIS — Z202 Contact with and (suspected) exposure to infections with a predominantly sexual mode of transmission: Secondary | ICD-10-CM

## 2020-01-11 DIAGNOSIS — Z113 Encounter for screening for infections with a predominantly sexual mode of transmission: Secondary | ICD-10-CM

## 2020-01-11 MED ORDER — AZITHROMYCIN 250 MG PO TABS
1000.0000 mg | ORAL_TABLET | Freq: Once | ORAL | Status: AC
  Administered 2020-01-11: 1000 mg via ORAL

## 2020-01-11 MED ORDER — AZITHROMYCIN 250 MG PO TABS
ORAL_TABLET | ORAL | Status: AC
  Filled 2020-01-11: qty 4

## 2020-01-11 NOTE — Discharge Instructions (Addendum)
Avoid all forms of sexual intercourse (oral, vaginal, anal) for the next 7 days to avoid spreading/reinfecting. Return if symptoms worsen/do not resolve, you develop fever, abdominal pain, blood in your urine, or are re-exposed to an STI.  

## 2020-01-11 NOTE — ED Provider Notes (Signed)
[facility]   MRN: [id number] DOB: 09-Jan-1991  Subjective:   Steven [name] is a 29 y.o. male presenting for STI testing.  Patient states that he had sex 2 weeks ago, did not use condoms.  Male he had sex with turned out to have chlamydia.  Is requesting medication treatment in clinic as he does not have good transportation or financial resources to pay for medications as an outpatient.  No current facility-administered medications for this encounter.  Current Outpatient Medications:  .  gabapentin (NEURONTIN) 600 MG tablet, Take 0.5 tablets (300 mg total) by mouth daily., Disp: 30 tablet, Rfl: 3   No Known Allergies  History reviewed. No pertinent past medical history.   Past Surgical History:  Procedure Laterality Date  . ORIF HUMERUS FRACTURE Right 08/09/2016   Procedure: OPEN REDUCTION INTERNAL FIXATION (ORIF) DISTAL HUMERUS FRACTURE;  Surgeon: Myrene Galas, MD;  Location: [facility] OR;  Service: Orthopedics;  Laterality: Right;  . ORIF TIBIA FRACTURE Right 08/09/2016   Procedure: OPEN REDUCTION INTERNAL FIXATION (ORIF) TIBIA PLATEAU  FRACTURE;  Surgeon: Myrene Galas, MD;  Location: [facility] OR;  Service: Orthopedics;  Laterality: Right;    History reviewed. No pertinent family history.  Social History   Tobacco Use  . Smoking status: Current Every Day Smoker    Types: Cigars  . Smokeless tobacco: Never Used  Substance Use Topics  . Alcohol use: Yes    Comment: occ  . Drug use: No    ROS Denies dysuria, hematuria, urinary frequency, penile discharge, penile swelling, testicular pain, testicular swelling, anal pain, groin pain.   Objective:   Vitals: BP 125/67 (BP Location: Right Arm)   Pulse 61   Temp 98.5 F (36.9 C) (Oral)   Resp 18   SpO2 99%   Physical Exam Constitutional:      General: He is not in acute distress.    Appearance: Normal appearance. He is well-developed and normal weight. He is not ill-appearing, toxic-appearing or diaphoretic.  HENT:    Head: Normocephalic and atraumatic.     Right Ear: External ear normal.     Left Ear: External ear normal.     Nose: Nose normal.     Mouth/Throat:     Pharynx: Oropharynx is clear.  Eyes:     General: No scleral icterus.       Right eye: No discharge.        Left eye: No discharge.     Extraocular Movements: Extraocular movements intact.     Pupils: Pupils are equal, round, and reactive to light.  Cardiovascular:     Rate and Rhythm: Normal rate.  Pulmonary:     Effort: Pulmonary effort is normal.  Genitourinary:    Penis: Circumcised. No phimosis, paraphimosis, hypospadias, erythema, tenderness, discharge, swelling or lesions.   Musculoskeletal:     Cervical back: Normal range of motion.  Neurological:     Mental Status: He is alert and oriented to person, place, and time.  Psychiatric:        Mood and Affect: Mood normal.        Behavior: Behavior normal.        Thought Content: Thought content normal.        Judgment: Judgment normal.      Assessment and Plan :   1. Exposure to chlamydia   2. Screening for STD (sexually transmitted disease)     Patient treated in clinic with azithromycin 1 g.  Counseled on abstaining for 1 week following  treatment.  Labs pending. Counseled patient on potential for adverse effects with medications prescribed/recommended today, ER and return-to-clinic precautions discussed, patient verbalized understanding.    Jaynee Eagles, [place] 01/11/20 1923

## 2020-01-11 NOTE — ED Notes (Signed)
I accessed patient's chart to obtain the orders for the cytology specimen. Patient was discharged before I could obtain the orders

## 2020-01-11 NOTE — ED Triage Notes (Signed)
Pt presents for STD Testing; pt states he is not having any symptoms.

## 2020-01-14 ENCOUNTER — Telehealth (HOSPITAL_COMMUNITY): Payer: Self-pay

## 2020-01-14 DIAGNOSIS — Z113 Encounter for screening for infections with a predominantly sexual mode of transmission: Secondary | ICD-10-CM

## 2020-01-14 LAB — CYTOLOGY, (ORAL, ANAL, URETHRAL) ANCILLARY ONLY
Chlamydia: POSITIVE — AB
Comment: NEGATIVE
Comment: NEGATIVE
Comment: NORMAL
Neisseria Gonorrhea: NEGATIVE
Trichomonas: POSITIVE — AB

## 2020-01-14 MED ORDER — METRONIDAZOLE 500 MG PO TABS: 500.0000 mg | ORAL_TABLET | Freq: Two times a day (BID) | ORAL | 0 refills | Status: DC

## 2020-01-14 NOTE — Telephone Encounter (Signed)
L/m for pt re lab results.  Requested c/b.  Rx sent.

## 2020-01-16 ENCOUNTER — Telehealth (HOSPITAL_COMMUNITY): Payer: Self-pay

## 2020-01-16 DIAGNOSIS — Z113 Encounter for screening for infections with a predominantly sexual mode of transmission: Secondary | ICD-10-CM

## 2020-01-16 MED ORDER — METRONIDAZOLE 500 MG PO TABS: 500.0000 mg | ORAL_TABLET | Freq: Two times a day (BID) | ORAL | 0 refills | Status: DC

## 2020-07-13 ENCOUNTER — Ambulatory Visit (HOSPITAL_COMMUNITY)
Admission: EM | Admit: 2020-07-13 | Discharge: 2020-07-13 | Disposition: A | Discharge status: Home / Self Care | Payer: Self-pay | Attending: Family Medicine | Admitting: Family Medicine

## 2020-07-13 ENCOUNTER — Other Ambulatory Visit: Payer: Self-pay

## 2020-07-13 ENCOUNTER — Encounter (HOSPITAL_COMMUNITY): Payer: Self-pay

## 2020-07-13 DIAGNOSIS — M67431 Ganglion, right wrist: Secondary | ICD-10-CM

## 2020-07-13 MED ORDER — MELOXICAM 15 MG PO TABS: 15.0000 mg | ORAL_TABLET | Freq: Every day | ORAL | 0 refills | Status: DC
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
## 2020-07-13 NOTE — Discharge Instructions (Addendum)
Take Mobic once a day.  This is an anti-inflammatory pain medicine.  I am hopeful that will reduce the pain and swelling in your wrist Follow-up with the [facility] clinic. You need to see a hand specialist for management of your cyst and your nerve damage

## 2020-07-13 NOTE — ED Triage Notes (Signed)
Pt reports having a cyst in the right wrist x 3 months. Reports is painful when he is at work, picking up items (20 lbs). Pt not taking any medication for the pain.

## 2020-08-07 ENCOUNTER — Ambulatory Visit: Payer: Self-pay | Admitting: Internal Medicine

## 2020-10-31 ENCOUNTER — Ambulatory Visit: Payer: Self-pay | Attending: Internal Medicine | Admitting: Internal Medicine

## 2020-10-31 ENCOUNTER — Other Ambulatory Visit: Payer: Self-pay | Admitting: Internal Medicine

## 2020-10-31 ENCOUNTER — Other Ambulatory Visit: Payer: Self-pay

## 2020-10-31 ENCOUNTER — Encounter: Payer: Self-pay | Admitting: Internal Medicine

## 2020-10-31 VITALS — BP 108/62 | HR 48 | Temp 97.7°F | Resp 16 | Ht 70.0 in | Wt 141.6 lb

## 2020-10-31 DIAGNOSIS — Z2821 Immunization not carried out because of patient refusal: Secondary | ICD-10-CM | POA: Insufficient documentation

## 2020-10-31 DIAGNOSIS — M67431 Ganglion, right wrist: Secondary | ICD-10-CM

## 2020-10-31 DIAGNOSIS — F17210 Nicotine dependence, cigarettes, uncomplicated: Secondary | ICD-10-CM

## 2020-10-31 MED ORDER — MELOXICAM 15 MG PO TABS: 15.0000 mg | ORAL_TABLET | Freq: Every day | ORAL | 2 refills | Status: DC

## 2020-10-31 MED ORDER — NICOTINE POLACRILEX 2 MG MT GUM: CHEWING_GUM | OROMUCOSAL | 2 refills | Status: DC

## 2020-10-31 MED FILL — MELOXICAM 15 MG TABLET: 15 | 30 days supply | Qty: 30 | Fill #0

## 2020-10-31 NOTE — Patient Instructions (Signed)
Let me know once you have been approved for the orange card or the cone discount card so that I can submit the referral to the orthopedic specialist.  Use the nicotine gum as discussed to help decrease your cravings for cigarettes as you attempt to quit smoking.   Steps to Quit Smoking Smoking tobacco is the leading cause of preventable death. It can affect almost every organ in the body. Smoking puts you and people around you at risk for many serious, long-lasting (chronic) diseases. Quitting smoking can be hard, but it is one of the best things that you can do for your health. It is never too late to quit. How do I get ready to quit? When you decide to quit smoking, make a plan to help you succeed. Before you quit:  Pick a date to quit. Set a date within the next 2 weeks to give you time to prepare.  Write down the reasons why you are quitting. Keep this list in places where you will see it often.  Tell your family, friends, and co-workers that you are quitting. Their support is important.  Talk with your doctor about the choices that may help you quit.  Find out if your health insurance will pay for these treatments.  Know the people, places, things, and activities that make you want to smoke (triggers). Avoid them. What first steps can I take to quit smoking?  Throw away all cigarettes at home, at work, and in your car.  Throw away the things that you use when you smoke, such as ashtrays and lighters.  Clean your car. Make sure to empty the ashtray.  Clean your home, including curtains and carpets. What can I do to help me quit smoking? Talk with your doctor about taking medicines and seeing a counselor at the same time. You are more likely to succeed when you do both.  If you are pregnant or breastfeeding, talk with your doctor about counseling or other ways to quit smoking. Do not take medicine to help you quit smoking unless your doctor tells you to do so. To quit  smoking: Quit right away  Quit smoking totally, instead of slowly cutting back on how much you smoke over a period of time.  Go to counseling. You are more likely to quit if you go to counseling sessions regularly. Take medicine You may take medicines to help you quit. Some medicines need a prescription, and some you can buy over-the-counter. Some medicines may contain a drug called nicotine to replace the nicotine in cigarettes. Medicines may:  Help you to stop having the desire to smoke (cravings).  Help to stop the problems that come when you stop smoking (withdrawal symptoms). Your doctor may ask you to use:  Nicotine patches, gum, or lozenges.  Nicotine inhalers or sprays.  Non-nicotine medicine that is taken by mouth. Find resources Find resources and other ways to help you quit smoking and remain smoke-free after you quit. These resources are most helpful when you use them often. They include:  Online chats with a Veterinary surgeon.  Phone quitlines.  Printed Materials engineer.  Support groups or group counseling.  Text messaging programs.  Mobile phone apps. Use apps on your mobile phone or tablet that can help you stick to your quit plan. There are many free apps for mobile phones and tablets as well as websites. Examples include Quit Guide from the [employer] and smokefree.gov   What things can I do to make it easier to  quit?  Talk to your family and friends. Ask them to support and encourage you.  Call a phone quitline (1-800-QUIT-NOW), reach out to support groups, or work with a Veterinary surgeon.  Ask people who smoke to not smoke around you.  Avoid places that make you want to smoke, such as: ? Bars. ? Parties. ? Smoke-break areas at work.  Spend time with people who do not smoke.  Lower the stress in your life. Stress can make you want to smoke. Try these things to help your stress: ? Getting regular exercise. ? Doing deep-breathing exercises. ? Doing  yoga. ? Meditating. ? Doing a body scan. To do this, close your eyes, focus on one area of your body at a time from head to toe. Notice which parts of your body are tense. Try to relax the muscles in those areas.   How will I feel when I quit smoking? Day 1 to 3 weeks Within the first 24 hours, you may start to have some problems that come from quitting tobacco. These problems are very bad 2-3 days after you quit, but they do not often last for more than 2-3 weeks. You may get these symptoms:  Mood swings.  Feeling restless, nervous, angry, or annoyed.  Trouble concentrating.  Dizziness.  Strong desire for high-sugar foods and nicotine.  Weight gain.  Trouble pooping (constipation).  Feeling like you may vomit (nausea).  Coughing or a sore throat.  Changes in how the medicines that you take for other issues work in your body.  Depression.  Trouble sleeping (insomnia). Week 3 and afterward After the first 2-3 weeks of quitting, you may start to notice more positive results, such as:  Better sense of smell and taste.  Less coughing and sore throat.  Slower heart rate.  Lower blood pressure.  Clearer skin.  Better breathing.  Fewer sick days. Quitting smoking can be hard. Do not give up if you fail the first time. Some people need to try a few times before they succeed. Do your best to stick to your quit plan, and talk with your doctor if you have any questions or concerns. Summary  Smoking tobacco is the leading cause of preventable death. Quitting smoking can be hard, but it is one of the best things that you can do for your health.  When you decide to quit smoking, make a plan to help you succeed.  Quit smoking right away, not slowly over a period of time.  When you start quitting, seek help from your doctor, family, or friends. This information is not intended to replace advice given to you by your health care provider. Make sure you discuss any questions you  have with your health care provider. Document Revised: 06/18/2019 Document Reviewed: 12/12/2018 Elsevier Patient Education  2021 [employer].

## 2020-10-31 NOTE — Progress Notes (Signed)
Pt states he has a cyst on right wrist  Pt is requesting a rx for Meloxicam
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
## 2020-11-10 ENCOUNTER — Ambulatory Visit: Payer: Self-pay

## 2020-11-16 MED FILL — MELOXICAM 15 MG TABLET: 15 | 30 days supply | Qty: 30 | Fill #0

## 2020-12-25 MED FILL — MELOXICAM 15 MG TABLET: 15 | 30 days supply | Qty: 30 | Fill #1

## 2021-02-13 ENCOUNTER — Other Ambulatory Visit: Payer: Self-pay

## 2021-02-13 MED FILL — Meloxicam Tab 15 MG: ORAL | 30 days supply | Qty: 30 | Fill #0 | Status: CN

## 2021-02-20 ENCOUNTER — Other Ambulatory Visit: Payer: Self-pay

## 2021-02-23 ENCOUNTER — Other Ambulatory Visit: Payer: Self-pay | Admitting: Internal Medicine

## 2021-02-23 DIAGNOSIS — M67431 Ganglion, right wrist: Secondary | ICD-10-CM

## 2021-02-23 MED ORDER — MELOXICAM 15 MG PO TABS
ORAL_TABLET | Freq: Every day | ORAL | 3 refills | Status: AC
  Filled 2021-07-04 – 2021-07-13 (×2): qty 30, 30d supply, fill #0

## 2021-02-23 NOTE — Telephone Encounter (Signed)
Medication Refill - Medication: meloxicam (MOBIC) 15 MG tablet   Preferred Pharmacy (with phone number or street name):  [id number] - [place], [place] - [address] AT [address] Phone:  [phone]  Fax:  [phone]        Agent: Please be advised that RX refills may take up to 3 business days. We ask that you follow-up with your pharmacy.

## 2021-07-03 ENCOUNTER — Other Ambulatory Visit: Payer: Self-pay

## 2021-07-04 ENCOUNTER — Other Ambulatory Visit: Payer: Self-pay

## 2021-07-11 ENCOUNTER — Other Ambulatory Visit: Payer: Self-pay

## 2021-07-13 ENCOUNTER — Encounter (HOSPITAL_COMMUNITY): Payer: Self-pay

## 2021-07-13 ENCOUNTER — Other Ambulatory Visit: Payer: Self-pay

## 2021-07-13 ENCOUNTER — Emergency Department (HOSPITAL_COMMUNITY)
Admission: EM | Admit: 2021-07-13 | Discharge: 2021-07-13 | Disposition: A | Discharge status: Home / Self Care | Payer: Self-pay | Attending: Emergency Medicine | Admitting: Emergency Medicine

## 2021-07-13 DIAGNOSIS — L723 Sebaceous cyst: Secondary | ICD-10-CM | POA: Insufficient documentation

## 2021-07-13 DIAGNOSIS — F17210 Nicotine dependence, cigarettes, uncomplicated: Secondary | ICD-10-CM | POA: Insufficient documentation

## 2021-07-13 DIAGNOSIS — L089 Local infection of the skin and subcutaneous tissue, unspecified: Secondary | ICD-10-CM

## 2021-07-13 MED ORDER — LIDOCAINE-EPINEPHRINE (PF) 2 %-1:200000 IJ SOLN
20.0000 mL | Freq: Once | INTRAMUSCULAR | Status: AC
  Administered 2021-07-13: 20 mL
  Filled 2021-07-13: qty 20

## 2021-07-13 NOTE — ED Triage Notes (Signed)
Pt arrived via POV, c/o cyst on right wrist. Pain worsening.
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
## 2021-07-13 NOTE — Discharge Instructions (Addendum)
You have been seen in the ED for a Cyst.  An incision and drainage was performed to facilitate drainage of the fluid.  Please return if the Emergency Department if the abscess worsens or you develop symptoms such as fever.

## 2021-07-15 ENCOUNTER — Encounter (HOSPITAL_COMMUNITY): Payer: Self-pay

## 2021-07-15 ENCOUNTER — Other Ambulatory Visit: Payer: Self-pay

## 2021-07-15 ENCOUNTER — Emergency Department (HOSPITAL_COMMUNITY)
Admission: EM | Admit: 2021-07-15 | Discharge: 2021-07-15 | Disposition: A | Discharge status: Home / Self Care | Payer: Self-pay | Attending: Emergency Medicine | Admitting: Emergency Medicine

## 2021-07-15 DIAGNOSIS — Z113 Encounter for screening for infections with a predominantly sexual mode of transmission: Secondary | ICD-10-CM | POA: Insufficient documentation

## 2021-07-15 DIAGNOSIS — F17290 Nicotine dependence, other tobacco product, uncomplicated: Secondary | ICD-10-CM | POA: Insufficient documentation

## 2021-07-15 DIAGNOSIS — Z202 Contact with and (suspected) exposure to infections with a predominantly sexual mode of transmission: Secondary | ICD-10-CM

## 2021-07-15 LAB — URINALYSIS, ROUTINE W REFLEX MICROSCOPIC
Bilirubin Urine: NEGATIVE
Glucose, UA: NEGATIVE mg/dL
Hgb urine dipstick: NEGATIVE
Ketones, ur: NEGATIVE mg/dL
Leukocytes,Ua: NEGATIVE
Nitrite: NEGATIVE
Protein, ur: NEGATIVE mg/dL
Specific Gravity, Urine: 1.006 (ref 1.005–1.030)
pH: 7 (ref 5.0–8.0)

## 2021-07-15 LAB — HIV ANTIBODY (ROUTINE TESTING W REFLEX): HIV Screen 4th Generation wRfx: NONREACTIVE
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
## 2021-07-15 NOTE — Discharge Instructions (Addendum)
You were seen today for STD testing. Your results should come back in the next 24 hours. Check mychart for test results.   Given your lack of symptoms, I do not feel that antibiotic prophylaxis is indicated at this time.  Follow up at your local health department for treatment if positive test result.  In the future, ensure that you are wearing protection when sexually active.

## 2021-07-15 NOTE — ED Triage Notes (Signed)
Pt presents with c/o wanting STD testing. Pt denies any symptoms at this time. Pt is also Covid positive.

## 2021-07-16 LAB — GC/CHLAMYDIA PROBE AMP (~~LOC~~) NOT AT ARMC
Chlamydia: NEGATIVE
Comment: NEGATIVE
Comment: NORMAL
Neisseria Gonorrhea: NEGATIVE

## 2021-07-16 LAB — RPR: RPR Ser Ql: NONREACTIVE

## 2022-08-13 ENCOUNTER — Emergency Department (HOSPITAL_COMMUNITY)
Admission: EM | Admit: 2022-08-13 | Discharge: 2022-08-13 | Discharge status: Against Medical Advice | Payer: Self-pay | Attending: Emergency Medicine | Admitting: Emergency Medicine

## 2022-08-13 ENCOUNTER — Encounter (HOSPITAL_COMMUNITY): Payer: Self-pay

## 2022-08-13 DIAGNOSIS — U071 COVID-19: Secondary | ICD-10-CM | POA: Insufficient documentation

## 2022-08-13 DIAGNOSIS — Z5321 Procedure and treatment not carried out due to patient leaving prior to being seen by health care provider: Secondary | ICD-10-CM | POA: Insufficient documentation

## 2022-08-13 LAB — RESP PANEL BY RT-PCR (FLU A&B, COVID) ARPGX2
Influenza A by PCR: NEGATIVE
Influenza B by PCR: NEGATIVE
SARS Coronavirus 2 by RT PCR: POSITIVE — AB

## 2022-08-13 NOTE — ED Triage Notes (Signed)
Pt arrived via POV, c/o overall body aches, worse in back. States that this is how he felt when he had COVID last.

## 2024-07-23 ENCOUNTER — Emergency Department (HOSPITAL_COMMUNITY)
Admission: EM | Admit: 2024-07-23 | Discharge: 2024-07-23 | Disposition: A | Discharge status: Home / Self Care | Payer: Self-pay | Attending: Emergency Medicine | Admitting: Emergency Medicine

## 2024-07-23 ENCOUNTER — Other Ambulatory Visit: Payer: Self-pay

## 2024-07-23 ENCOUNTER — Encounter (HOSPITAL_COMMUNITY): Payer: Self-pay | Admitting: Emergency Medicine

## 2024-07-23 DIAGNOSIS — K047 Periapical abscess without sinus: Secondary | ICD-10-CM | POA: Insufficient documentation

## 2024-07-23 DIAGNOSIS — K029 Dental caries, unspecified: Secondary | ICD-10-CM | POA: Insufficient documentation

## 2024-07-23 MED ORDER — KETOROLAC TROMETHAMINE 30 MG/ML IJ SOLN
30.0000 mg | Freq: Once | INTRAMUSCULAR | Status: AC
  Administered 2024-07-23: 30 mg via INTRAMUSCULAR

## 2024-07-23 MED ORDER — AMOXICILLIN-POT CLAVULANATE 875-125 MG PO TABS: 1.0000 | ORAL_TABLET | Freq: Two times a day (BID) | ORAL | 0 refills | Status: AC

## 2024-07-23 MED ORDER — KETOROLAC TROMETHAMINE 30 MG/ML IJ SOLN
30.0000 mg | Freq: Once | INTRAMUSCULAR | Status: DC
  Filled 2024-07-23: qty 1

## 2024-07-23 NOTE — Discharge Instructions (Addendum)
 You were seen in the emergency department today for concerns of dental pain.  You appear to have a dental infection ongoing needs to be seen by dentist for further evaluation and treatment.  But there is no obvious abscess at this time.

## 2024-07-23 NOTE — ED Triage Notes (Signed)
 Pt presents with dental pain and swelling on the right.  Hoping for anbx untli he can get a dentist appt.

## 2024-07-23 NOTE — ED Provider Notes (Signed)
 [name] AT [facility] Provider Note   CSN: [id number] Arrival date & time: 07/23/24  1310     Patient presents with: Dental Pain   Steven [name] is a 33 y.o. male.  Patient presents emergency department with concerns of dental pain.  He reports that he has dental pain and facial swelling to the right cheek.  He states that he has not been able to be seen by the dentist for further evaluation.   Dental Pain      Prior to Admission medications   Medication Sig Start Date End Date Taking? Authorizing Provider  amoxicillin-clavulanate (AUGMENTIN) 875-125 MG tablet Take 1 tablet by mouth every 12 (twelve) hours for 10 days. 07/23/24 08/02/24 Yes Guthrie Lemme A, PA-C  meloxicam  (MOBIC ) 15 MG tablet TAKE 1 TABLET (15 MG TOTAL) BY MOUTH DAILY. 02/23/21   Vicci Barnie NOVAK, MD  gabapentin  (NEURONTIN ) 600 MG tablet Take 0.5 tablets (300 mg total) by mouth daily. 11/20/16 07/13/20  Babs Arthea DASEN, MD    Allergies: Patient has no known allergies.    Review of Systems  HENT:  Positive for dental problem.   All other systems reviewed and are negative.   Updated Vital Signs BP 112/71 (BP Location: Left Arm)   Pulse (!) 52   Temp 98 F (36.7 C) (Oral)   Resp 17   SpO2 100%   Physical Exam Vitals and nursing note reviewed.  Constitutional:      General: He is not in acute distress.    Appearance: He is well-developed.  HENT:     Head: Normocephalic and atraumatic.     Comments: TTP along the right cheek with swelling present. No significant erythema. No appreciable gumboil or fluctuance along the gumline. Dentition is poor with multiple areas of caries. Eyes:     Extraocular Movements: Extraocular movements intact.     Conjunctiva/sclera: Conjunctivae normal.     Pupils: Pupils are equal, round, and reactive to light.  Musculoskeletal:        General: No swelling.  Skin:    General: Skin is warm and dry.     Capillary Refill: Capillary  refill takes less than 2 seconds.  Neurological:     Mental Status: He is alert.  Psychiatric:        Mood and Affect: Mood normal.     (all labs ordered are listed, but only abnormal results are displayed) Labs Reviewed - No data to display  EKG: None  Radiology: No results found.   Procedures   Medications Ordered in the ED  ketorolac (TORADOL) 30 MG/ML injection 30 mg (30 mg Intramuscular Given 07/23/24 1642)                                    Medical Decision Making Risk Prescription drug management.   This patient presents to the ED for concern of dental pain.  Differential diagnosis includes dental infection, abscess, tooth avulsion, gingivitis   Medicines ordered and prescription drug management:  I ordered medication including Toradol for pain Reevaluation of the patient after these medicines showed that the patient improved I have reviewed the patients home medicines and have made adjustments as needed   Problem List / ED Course:  Patient presents to the emergency department today with concerns of dental pain.  Reports that he noticed swelling to the right cheek about 1 or 2 days ago with  increasing pain in this area.  No reported fever, chills or bodyaches.  States that he is not currently have a dentist in place but is planning on seeing one in the next several days once his insurance kicks back in. Exam reveals no obvious findings on oral examination.  No appreciable gum boil or abscess site or drainage.  There is poor dentition throughout the mouth so likely source is from odontogenic infection. Based on presentation, will start patient on course of Augmentin to address likely bacterial source of dental infection.  Advise close follow-up with dentist for further evaluation.  Provided patient with dental resource guide.  Discharged home in stable condition.   Social Determinants of Health:  None  Final diagnoses:  Dental infection    ED Discharge  Orders          Ordered    amoxicillin-clavulanate (AUGMENTIN) 875-125 MG tablet  Every 12 hours        07/23/24 1641               Bartow Zylstra A, PA-C 07/23/24 1656    Ruthe Cornet, DO 07/23/24 1707
